# Patient Record
Sex: Female | Born: 1975 | Race: White | Hispanic: No | Marital: Married | Smoking: Current every day smoker
Health system: Southern US, Community
[De-identification: ages and names within clinical notes are randomized; demographics above are authoritative.]

## PROBLEM LIST (undated history)

## (undated) DIAGNOSIS — T8859XA Other complications of anesthesia, initial encounter: Secondary | ICD-10-CM

## (undated) DIAGNOSIS — C519 Malignant neoplasm of vulva, unspecified: Secondary | ICD-10-CM

## (undated) DIAGNOSIS — K219 Gastro-esophageal reflux disease without esophagitis: Secondary | ICD-10-CM

## (undated) DIAGNOSIS — I471 Supraventricular tachycardia, unspecified: Secondary | ICD-10-CM

## (undated) DIAGNOSIS — N751 Abscess of Bartholin's gland: Secondary | ICD-10-CM

## (undated) DIAGNOSIS — E785 Hyperlipidemia, unspecified: Secondary | ICD-10-CM

## (undated) DIAGNOSIS — E119 Type 2 diabetes mellitus without complications: Secondary | ICD-10-CM

## (undated) HISTORY — PX: APPENDECTOMY: SHX54

## (undated) HISTORY — DX: Supraventricular tachycardia: I47.1

## (undated) HISTORY — DX: Supraventricular tachycardia, unspecified: I47.10

## (undated) HISTORY — PX: WISDOM TOOTH EXTRACTION: SHX21

## (undated) HISTORY — PX: TONSILLECTOMY: SHX5217

## (undated) HISTORY — PX: BLADDER SUSPENSION: SHX72

## (undated) HISTORY — PX: ABSCESS DRAINAGE: SHX1119

## (undated) HISTORY — DX: Hyperlipidemia, unspecified: E78.5

## (undated) HISTORY — PX: OTHER SURGICAL HISTORY: SHX169

## (undated) HISTORY — DX: Type 2 diabetes mellitus without complications: E11.9

## (undated) HISTORY — PX: ABDOMINAL HYSTERECTOMY: SHX81

---

## 2017-03-15 ENCOUNTER — Other Ambulatory Visit (HOSPITAL_COMMUNITY): Payer: Self-pay | Admitting: Unknown Physician Specialty

## 2017-03-15 DIAGNOSIS — R1900 Intra-abdominal and pelvic swelling, mass and lump, unspecified site: Secondary | ICD-10-CM

## 2017-03-16 ENCOUNTER — Ambulatory Visit: Payer: Self-pay | Admitting: Family Medicine

## 2017-03-27 ENCOUNTER — Encounter (HOSPITAL_COMMUNITY): Payer: Self-pay | Admitting: Radiology

## 2017-03-27 ENCOUNTER — Encounter (HOSPITAL_COMMUNITY)
Admission: RE | Admit: 2017-03-27 | Discharge: 2017-03-27 | Disposition: A | Discharge status: Home / Self Care | Payer: No Typology Code available for payment source | Source: Ambulatory Visit | Attending: Unknown Physician Specialty | Admitting: Unknown Physician Specialty

## 2017-03-27 DIAGNOSIS — R1900 Intra-abdominal and pelvic swelling, mass and lump, unspecified site: Secondary | ICD-10-CM | POA: Diagnosis not present

## 2017-03-27 DIAGNOSIS — Z9049 Acquired absence of other specified parts of digestive tract: Secondary | ICD-10-CM

## 2017-03-27 LAB — GLUCOSE, CAPILLARY: GLUCOSE-CAPILLARY: 201 mg/dL — AB (ref 65–99)

## 2017-03-27 MED ORDER — FLUDEOXYGLUCOSE F - 18 (FDG) INJECTION
12.3300 | Freq: Once | INTRAVENOUS | Status: AC | PRN
  Administered 2017-03-27: 12.33 via INTRAVENOUS

## 2017-04-03 NOTE — Progress Notes (Deleted)
   Subjective: YW:VPXTGGYIR care, *** HPI: Toby Platt is a 42 y.o. female presenting to clinic today for:  1. ***  History reviewed. No pertinent past medical history. Past Surgical History:  Procedure Laterality Date  . ABDOMINAL HYSTERECTOMY    . APPENDECTOMY    . TONSILLECTOMY     Social History   Socioeconomic History  . Marital status: Married    Spouse name: Not on file  . Number of children: Not on file  . Years of education: Not on file  . Highest education level: Not on file  Social Needs  . Financial resource strain: Not on file  . Food insecurity - worry: Not on file  . Food insecurity - inability: Not on file  . Transportation needs - medical: Not on file  . Transportation needs - non-medical: Not on file  Occupational History  . Not on file  Tobacco Use  . Smoking status: Current Every Day Smoker    Packs/day: 1.00    Years: 15.00    Pack years: 15.00    Types: Cigarettes  . Smokeless tobacco: Never Used  Substance and Sexual Activity  . Alcohol use: No    Frequency: Never  . Drug use: No  . Sexual activity: Yes    Birth control/protection: None  Other Topics Concern  . Not on file  Social History Narrative  . Not on file   Current Meds  Medication Sig  . varenicline (CHANTIX) 1 MG tablet Take 1 mg by mouth.   Family History  Problem Relation Age of Onset  . Cancer Mother   . Ovarian cancer Mother 69  . Colon cancer Mother 82  . Heart disease Mother   . Heart disease Father   . Heart attack Father   . Breast cancer Maternal Aunt 41  . Breast cancer Maternal Aunt 47   No Known Allergies   Health Maintenance: Flu, TDap.  ROS: Per HPI  Objective: Office vital signs reviewed. BP 119/82   Pulse 91   Temp 97.6 F (36.4 C) (Oral)   Ht 5\' 9"  (1.753 m)   Wt 254 lb (115.2 kg)   BMI 37.51 kg/m   Physical Examination:  General: Awake, alert, *** nourished, No acute distress HEENT: Normal    Neck: No masses palpated. No  lymphadenopathy    Ears: Tympanic membranes intact, normal light reflex, no erythema, no bulging    Eyes: PERRLA, extraocular movement in tact, sclera ***    Nose: nasal turbinates moist, *** nasal discharge    Throat: moist mucus membranes, no erythema, *** tonsillar exudate.  Airway is patent Cardio: regular rate and rhythm, S1S2 heard, no murmurs appreciated Pulm: clear to auscultation bilaterally, no wheezes, rhonchi or rales; normal work of breathing on room air GI: soft, non-tender, non-distended, bowel sounds present x4, no hepatomegaly, no splenomegaly, no masses GU: external vaginal tissue ***, cervix ***, *** punctate lesions on cervix appreciated, *** discharge from cervical os, *** bleeding, *** cervical motion tenderness, *** abdominal/ adnexal masses Extremities: warm, well perfused, No edema, cyanosis or clubbing; +*** pulses bilaterally MSK: *** gait and *** station Skin: dry; intact; no rashes or lesions Neuro: *** Strength and light touch sensation grossly intact, *** DTRs ***/4  Assessment/ Plan: 42 y.o. female   No problem-specific Assessment & Plan notes found for this encounter.   Janora Norlander, DO [place] [phone]

## 2017-04-04 ENCOUNTER — Encounter: Payer: Self-pay | Admitting: Family Medicine

## 2017-04-04 ENCOUNTER — Ambulatory Visit: Payer: No Typology Code available for payment source | Admitting: Family Medicine

## 2017-04-04 VITALS — BP 119/82 | HR 91 | Temp 97.6°F | Ht 69.0 in | Wt 254.0 lb

## 2017-04-04 DIAGNOSIS — M62830 Muscle spasm of back: Secondary | ICD-10-CM | POA: Diagnosis not present

## 2017-04-04 DIAGNOSIS — E669 Obesity, unspecified: Secondary | ICD-10-CM | POA: Diagnosis not present

## 2017-04-04 DIAGNOSIS — Z7689 Persons encountering health services in other specified circumstances: Secondary | ICD-10-CM | POA: Diagnosis not present

## 2017-04-04 DIAGNOSIS — Z23 Encounter for immunization: Secondary | ICD-10-CM | POA: Diagnosis not present

## 2017-04-04 DIAGNOSIS — M25561 Pain in right knee: Secondary | ICD-10-CM

## 2017-04-04 DIAGNOSIS — L729 Follicular cyst of the skin and subcutaneous tissue, unspecified: Secondary | ICD-10-CM

## 2017-04-04 DIAGNOSIS — F17200 Nicotine dependence, unspecified, uncomplicated: Secondary | ICD-10-CM | POA: Diagnosis not present

## 2017-04-04 DIAGNOSIS — R6882 Decreased libido: Secondary | ICD-10-CM

## 2017-04-04 DIAGNOSIS — M25562 Pain in left knee: Secondary | ICD-10-CM

## 2017-04-04 DIAGNOSIS — G8929 Other chronic pain: Secondary | ICD-10-CM

## 2017-04-04 DIAGNOSIS — R935 Abnormal findings on diagnostic imaging of other abdominal regions, including retroperitoneum: Secondary | ICD-10-CM

## 2017-04-04 LAB — BAYER DCA HB A1C WAIVED: HB A1C (BAYER DCA - WAIVED): 7.3 % — ABNORMAL HIGH (ref <7.0)

## 2017-04-04 MED ORDER — TIZANIDINE HCL 2 MG PO CAPS: 2.0000 mg | ORAL_CAPSULE | Freq: Three times a day (TID) | ORAL | 0 refills | Status: DC | PRN

## 2017-04-04 MED ORDER — DICLOFENAC SODIUM 75 MG PO TBEC: 75.0000 mg | DELAYED_RELEASE_TABLET | Freq: Two times a day (BID) | ORAL | 0 refills | Status: DC

## 2017-04-04 NOTE — Patient Instructions (Addendum)
I have prescribed you Voltaren to take twice a day with a meal as needed for low back pain.  You may discontinue this once low back pain has resolved.  I have also prescribed you tizanidine to use up to 3 times daily if needed for breakthrough back pain/muscle spasm.  As we discussed, this can cause sedation.  Do not drive or operate heavy machinery while taking this.  You drink plenty of fluids and stay active.  Do the home exercises and stretches that I have provided you.  Follow-up if symptoms worsen or do not improve.  Schedule your mammogram  You have prescribed a nonsteroidal anti-inflammatory drug (NSAID) today. This will help with ear pain and inflammation. Please do not take any other NSAIDs (ibuprofen/Motrin/Advil, naproxen/Aleve, meloxicam/Mobic, Voltaren/diclofenac). Please make sure to eat a meal when taking this medication.   Caution:  If you have a history of acid reflux/indigestion, I recommend that you take an antacid (such as Prilosec, Prevacid) daily while on the NSAID.  If you have a history of bleeding disorder, gastric ulcer, are on a blood thinner (like warfarin/Coumadin, Xarelto, Eliquis, etc) please do not take NSAID.  If you have ever had a heart attack, you should not take NSAIDs.

## 2017-04-04 NOTE — Progress Notes (Signed)
Subjective: OI:NOMVEHMCN care, back pain HPI: Christina [name] is a 42 y.o. female presenting to clinic today for:  1. Back pain Patient notes that she was lifting a trailer on Saturday when she hurt her back.  She describes the pain as sharp and severe.  She notes that she has been using a TENS unit, topical lidocaine, Tylenol and ibuprofen with little improvement in symptoms.  She denies saddle anesthesia, urinary retention, fecal incontinence, lower extremity weakness, falls or lower extremity numbness and tingling.  2.  Tobacco use disorder Patient notes that she is currently being treated with Chantix to stop smoking.  This is been prescribed by her OB/GYN.  3.  Decreased sex drive Patient reports that she has had decreased sex drive since her 48S.  She notes she did not undergo her hysterectomy until 2013.  She has never seen a sex therapist.  She is wondering if there is something that we can do to improve her sex drive.  4.  Knot on right side Patient notes that she has had bilateral knots on her flanks for some time but they seem to have gotten slightly larger over the last year.  She denies tenderness, swelling, skin changes. No unplanned weight loss.  5.  Abnormal MRI of the pelvis Patient has been seen by OB/GYN for Bartholin cyst and abnormal MRI of the pelvis.  She was noted to have 2 complex cystic lesions within the abdomen.  These were further evaluated with a PET scan determined to be likely benign.  She notes that she occasionally has intra-abdominal pains but does report she has had a couple of intra-abdominal surgeries in the past.  She had a hysterectomy in 2013 and an appendectomy in the past as well.  History reviewed. No pertinent past medical history. Past Surgical History:  Procedure Laterality Date  . ABDOMINAL HYSTERECTOMY    . APPENDECTOMY    . TONSILLECTOMY     Social History   Socioeconomic History  . Marital status: Married    Spouse name: Not on file    . Number of children: Not on file  . Years of education: Not on file  . Highest education level: Not on file  Social Needs  . Financial resource strain: Not on file  . Food insecurity - worry: Not on file  . Food insecurity - inability: Not on file  . Transportation needs - medical: Not on file  . Transportation needs - non-medical: Not on file  Occupational History  . Not on file  Tobacco Use  . Smoking status: Current Every Day Smoker    Packs/day: 1.00    Years: 15.00    Pack years: 15.00    Types: Cigarettes  . Smokeless tobacco: Never Used  Substance and Sexual Activity  . Alcohol use: No    Frequency: Never  . Drug use: No  . Sexual activity: Yes    Birth control/protection: None  Other Topics Concern  . Not on file  Social History Narrative  . Not on file   Current Meds  Medication Sig  . varenicline (CHANTIX) 1 MG tablet Take 1 mg by mouth.   Family History  Problem Relation Age of Onset  . Cancer Mother   . Ovarian cancer Mother 23  . Colon cancer Mother 66  . Heart disease Mother   . Heart disease Father   . Heart attack Father   . Breast cancer Maternal Aunt 41  . Breast cancer Maternal Aunt 47  No Known Allergies   Health Maintenance: Flu, TDap.  ROS: Per HPI  Objective: Office vital signs reviewed. BP 119/82   Pulse 91   Temp 97.6 F (36.4 C) (Oral)   Ht 5' 9"  (1.753 m)   Wt 254 lb (115.2 kg)   BMI 37.51 kg/m   Physical Examination:  General: Awake, alert, obese, No acute distress HEENT: sclera white, MMM Cardio: regular rate and rhythm, S1S2 heard, no murmurs appreciated Pulm: clear to auscultation bilaterally, no wheezes, rhonchi or rales; normal work of breathing on room air Skin: Kidney being size rubbery mass consistent with a benign skin cyst noted on the right flank inferior to the rib cage.  This is nontender.  No overlying skin changes. MSK: Ambulates independently.  Antalgic gait   Lumbar spine: Patient has full active  range of motion but does have notable pain with flexion and extension of the spine.  She has no midline tenderness to palpation.  She does have quite a bit of tenderness to palpation in the paraspinal muscles particularly on the right in the lumbar regions.  There is palpable increased muscle tonicity in this area.  No palpable bony abnormalities.  Negative straight leg raise.  Knees: Palpable crepitus in bilateral knees.  Full active range of motion. Neuro: Light touch sensation grossly intact.  Follows all commands.  No focal neurologic deficits. Psych: Mood stable, speech normal, affect appropriate, good eye contact, normal insight and thought.  Depression screen PHQ 2/9 04/04/2017  Decreased Interest 2  Down, Depressed, Hopeless 2  PHQ - 2 Score 4  Altered sleeping 2  Tired, decreased energy 3  Change in appetite 1  Feeling bad or failure about yourself  1  Trouble concentrating 0  Suicidal thoughts 0  PHQ-9 Score 11  Difficult doing work/chores Somewhat difficult    Assessment/ Plan: 42 y.o. female   1. Tobacco use disorder Counseling provided.  Patient is actually on Chantix prescribed by her OB/GYN.  We will continue to monitor and support as able.  2. Establishing care with new doctor, encounter for Records reviewed and placed into the scanned pile for addition to her EMR.  She had an abnormal MRI of her pelvis, which was followed by a PET scan.  The PET scan suggested that the lesions that were appreciated on CT and MRI previously were likely benign but should be followed up with an MRI of her pelvis in February 2020.  We will continue to monitor patient closely.  3. Obesity (BMI 35.0-39.9 without comorbidity) Lifestyle changes recommended.  Will check basic metabolic labs to further evaluate. - CMP14+EGFR - Lipid Panel - Bayer DCA Hb A1c Waived  4. Abnormal MRI, pelvis See above. - CBC with Differential  5. Chronic pain of both knees I recommended the patient consider  taking Tylenol every 8 hours if needed for intermittent pain.  Can consider steroid injection should she need this for flares in the future.  At this time she has been asymptomatic.  6. Lumbar paraspinal muscle spasm I suspect that she is having a muscle spasm of the lumbar paraspinals.  There were no red flags on her exam or history today.  No evidence on exam to suggest disc herniation or nerve impingement.  I have placed her on Voltaren p.o. twice daily for the next week to use as needed.  Avoid other NSAIDs.  I have also prescribed Zanaflex 2 mg p.o. 3 times daily as needed breakthrough pain/muscle spasm.  Caution sedation.  Home care  instructions reviewed with the patient and handout was provided.  Home exercise program reviewed with the patient and a handout was also provided.  She will follow-up as needed with me for this issue.  7. Skin cyst Lesion on right flank appears to be benign.  She is actually had multiple imaging studies that did not suggest she had any malignant processes going on.  We discussed consideration for referral to general surgery versus plastics to have this removed should she find this irritating but she does not wish to have any invasive procedures done.  8. Decreased sex drive We discussed that there are no medications to improve sex drive, particularly in a female.  However, I did discuss that she consider seeing a therapist versus sex therapist.  Patient would be amenable to this and will look further into this.   Janora Norlander, DO [place] [phone]

## 2017-04-05 LAB — CBC WITH DIFFERENTIAL/PLATELET
Basophils Absolute: 0 10*3/uL (ref 0.0–0.2)
Basos: 0 %
EOS (ABSOLUTE): 0.1 10*3/uL (ref 0.0–0.4)
EOS: 1 %
HEMATOCRIT: 45.6 % (ref 34.0–46.6)
Hemoglobin: 15.8 g/dL (ref 11.1–15.9)
IMMATURE GRANS (ABS): 0 10*3/uL (ref 0.0–0.1)
IMMATURE GRANULOCYTES: 0 %
Lymphocytes Absolute: 2.8 10*3/uL (ref 0.7–3.1)
Lymphs: 24 %
MCH: 31 pg (ref 26.6–33.0)
MCHC: 34.6 g/dL (ref 31.5–35.7)
MCV: 89 fL (ref 79–97)
MONOCYTES: 7 %
Monocytes Absolute: 0.9 10*3/uL (ref 0.1–0.9)
Neutrophils Absolute: 8.2 10*3/uL — ABNORMAL HIGH (ref 1.4–7.0)
Neutrophils: 68 %
Platelets: 332 10*3/uL (ref 150–379)
RBC: 5.1 x10E6/uL (ref 3.77–5.28)
RDW: 14 % (ref 12.3–15.4)
WBC: 12.1 10*3/uL — AB (ref 3.4–10.8)

## 2017-04-05 LAB — CMP14+EGFR
ALBUMIN: 4.4 g/dL (ref 3.5–5.5)
ALK PHOS: 132 IU/L — AB (ref 39–117)
ALT: 25 IU/L (ref 0–32)
AST: 18 IU/L (ref 0–40)
Albumin/Globulin Ratio: 1.4 (ref 1.2–2.2)
BUN/Creatinine Ratio: 16 (ref 9–23)
BUN: 10 mg/dL (ref 6–24)
Bilirubin Total: <0.2 mg/dL (ref 0.0–1.2)
CO2: 20 mmol/L (ref 20–29)
CREATININE: 0.64 mg/dL (ref 0.57–1.00)
Calcium: 10 mg/dL (ref 8.7–10.2)
Chloride: 102 mmol/L (ref 96–106)
GFR calc Af Amer: 128 mL/min/{1.73_m2} (ref >59)
GFR calc non Af Amer: 111 mL/min/{1.73_m2} (ref >59)
GLUCOSE: 235 mg/dL — AB (ref 65–99)
Globulin, Total: 3.2 g/dL (ref 1.5–4.5)
Potassium: 4.6 mmol/L (ref 3.5–5.2)
Sodium: 138 mmol/L (ref 134–144)
Total Protein: 7.6 g/dL (ref 6.0–8.5)

## 2017-04-05 LAB — LIPID PANEL
CHOL/HDL RATIO: 5.6 ratio — AB (ref 0.0–4.4)
Cholesterol, Total: 258 mg/dL — ABNORMAL HIGH (ref 100–199)
HDL: 46 mg/dL (ref >39)
LDL CALC: 178 mg/dL — AB (ref 0–99)
TRIGLYCERIDES: 172 mg/dL — AB (ref 0–149)
VLDL Cholesterol Cal: 34 mg/dL (ref 5–40)

## 2017-04-23 ENCOUNTER — Ambulatory Visit (INDEPENDENT_AMBULATORY_CARE_PROVIDER_SITE_OTHER): Payer: No Typology Code available for payment source | Admitting: Family Medicine

## 2017-04-23 ENCOUNTER — Encounter: Payer: Self-pay | Admitting: Family Medicine

## 2017-04-23 VITALS — BP 128/89 | HR 98 | Temp 97.8°F | Ht 69.0 in | Wt 249.0 lb

## 2017-04-23 DIAGNOSIS — E1169 Type 2 diabetes mellitus with other specified complication: Secondary | ICD-10-CM | POA: Insufficient documentation

## 2017-04-23 DIAGNOSIS — E785 Hyperlipidemia, unspecified: Secondary | ICD-10-CM | POA: Diagnosis not present

## 2017-04-23 DIAGNOSIS — E119 Type 2 diabetes mellitus without complications: Secondary | ICD-10-CM | POA: Diagnosis not present

## 2017-04-23 MED ORDER — ATORVASTATIN CALCIUM 40 MG PO TABS: 40.0000 mg | ORAL_TABLET | Freq: Every day | ORAL | 3 refills | Status: DC

## 2017-04-23 MED ORDER — METFORMIN HCL ER 500 MG PO TB24: 500.0000 mg | ORAL_TABLET | Freq: Every day | ORAL | 2 refills | Status: DC

## 2017-04-23 MED ORDER — METFORMIN HCL ER 500 MG PO TB24: 500.0000 mg | ORAL_TABLET | Freq: Two times a day (BID) | ORAL | 2 refills | Status: DC

## 2017-04-23 NOTE — Assessment & Plan Note (Signed)
Newly diagnosed.  A1c of 7.3.  Start metformin XR 500 mg nightly for the next 7-10 days then increase to 1000 mg nightly.  Statin as below.  Referred to registered dietitian.  Handout for nutrition and diabetes given.  Foot exam performed and was within normal limits.  We will plan to obtain urine micro, repeat A1c and direct LDL at next visit.  Patient will also need pneumococcal vaccine and referral for DM eye exam.  Follow up in 3 months.

## 2017-04-23 NOTE — Patient Instructions (Addendum)
Start with taking metformin every night at bedtime for about 7-10 days.  You may then increase to twice daily dosing.  Diabetes Mellitus and Nutrition When you have diabetes (diabetes mellitus), it is very important to have healthy eating habits because your blood sugar (glucose) levels are greatly affected by what you eat and drink. Eating healthy foods in the appropriate amounts, at about the same times every day, can help you:  Control your blood glucose.  Lower your risk of heart disease.  Improve your blood pressure.  Reach or maintain a healthy weight.  Every person with diabetes is different, and each person has different needs for a meal plan. Your health care provider may recommend that you work with a diet and nutrition specialist (dietitian) to make a meal plan that is best for you. Your meal plan may vary depending on factors such as:  The calories you need.  The medicines you take.  Your weight.  Your blood glucose, blood pressure, and cholesterol levels.  Your activity level.  Other health conditions you have, such as heart or kidney disease.  How do carbohydrates affect me? Carbohydrates affect your blood glucose level more than any other type of food. Eating carbohydrates naturally increases the amount of glucose in your blood. Carbohydrate counting is a method for keeping track of how many carbohydrates you eat. Counting carbohydrates is important to keep your blood glucose at a healthy level, especially if you use insulin or take certain oral diabetes medicines. It is important to know how many carbohydrates you can safely have in each meal. This is different for every person. Your dietitian can help you calculate how many carbohydrates you should have at each meal and for snack. Foods that contain carbohydrates include:  Bread, cereal, rice, pasta, and crackers.  Potatoes and corn.  Peas, beans, and lentils.  Milk and yogurt.  Fruit and juice.  Desserts,  such as cakes, cookies, ice cream, and candy.  How does alcohol affect me? Alcohol can cause a sudden decrease in blood glucose (hypoglycemia), especially if you use insulin or take certain oral diabetes medicines. Hypoglycemia can be a life-threatening condition. Symptoms of hypoglycemia (sleepiness, dizziness, and confusion) are similar to symptoms of having too much alcohol. If your health care provider says that alcohol is safe for you, follow these guidelines:  Limit alcohol intake to no more than 1 drink per day for nonpregnant women and 2 drinks per day for men. One drink equals 12 oz of beer, 5 oz of wine, or 1 oz of hard liquor.  Do not drink on an empty stomach.  Keep yourself hydrated with water, diet soda, or unsweetened iced tea.  Keep in mind that regular soda, juice, and other mixers may contain a lot of sugar and must be counted as carbohydrates.  What are tips for following this plan? Reading food labels  Start by checking the serving size on the label. The amount of calories, carbohydrates, fats, and other nutrients listed on the label are based on one serving of the food. Many foods contain more than one serving per package.  Check the total grams (g) of carbohydrates in one serving. You can calculate the number of servings of carbohydrates in one serving by dividing the total carbohydrates by 15. For example, if a food has 30 g of total carbohydrates, it would be equal to 2 servings of carbohydrates.  Check the number of grams (g) of saturated and trans fats in one serving. Choose foods that  have low or no amount of these fats.  Check the number of milligrams (mg) of sodium in one serving. Most people should limit total sodium intake to less than 2,300 mg per day.  Always check the nutrition information of foods labeled as "low-fat" or "nonfat". These foods may be higher in added sugar or refined carbohydrates and should be avoided.  Talk to your dietitian to identify  your daily goals for nutrients listed on the label. Shopping  Avoid buying canned, premade, or processed foods. These foods tend to be high in fat, sodium, and added sugar.  Shop around the outside edge of the grocery store. This includes fresh fruits and vegetables, bulk grains, fresh meats, and fresh dairy. Cooking  Use low-heat cooking methods, such as baking, instead of high-heat cooking methods like deep frying.  Cook using healthy oils, such as olive, canola, or sunflower oil.  Avoid cooking with butter, cream, or high-fat meats. Meal planning  Eat meals and snacks regularly, preferably at the same times every day. Avoid going long periods of time without eating.  Eat foods high in fiber, such as fresh fruits, vegetables, beans, and whole grains. Talk to your dietitian about how many servings of carbohydrates you can eat at each meal.  Eat 4-6 ounces of lean protein each day, such as lean meat, chicken, fish, eggs, or tofu. 1 ounce is equal to 1 ounce of meat, chicken, or fish, 1 egg, or 1/4 cup of tofu.  Eat some foods each day that contain healthy fats, such as avocado, nuts, seeds, and fish. Lifestyle   Check your blood glucose regularly.  Exercise at least 30 minutes 5 or more days each week, or as told by your health care provider.  Take medicines as told by your health care provider.  Do not use any products that contain nicotine or tobacco, such as cigarettes and e-cigarettes. If you need help quitting, ask your health care provider.  Work with a Social worker or diabetes educator to identify strategies to manage stress and any emotional and social challenges. What are some questions to ask my health care provider?  Do I need to meet with a diabetes educator?  Do I need to meet with a dietitian?  What number can I call if I have questions?  When are the best times to check my blood glucose? Where to find more information:  American Diabetes Association:  diabetes.org/food-and-fitness/food  Academy of Nutrition and Dietetics: PokerClues.dk  [employer] of Diabetes and Digestive and Kidney Diseases (NIH): ContactWire.be Summary  A healthy meal plan will help you control your blood glucose and maintain a healthy lifestyle.  Working with a diet and nutrition specialist (dietitian) can help you make a meal plan that is best for you.  Keep in mind that carbohydrates and alcohol have immediate effects on your blood glucose levels. It is important to count carbohydrates and to use alcohol carefully. This information is not intended to replace advice given to you by your health care provider. Make sure you discuss any questions you have with your health care provider. Document Released: 10/27/2004 Document Revised: 03/06/2016 Document Reviewed: 03/06/2016 Elsevier Interactive Patient Education  [employer].

## 2017-04-23 NOTE — Assessment & Plan Note (Signed)
Normal LFTs.  Start Lipitor 40 mg nightly.  We will plan to recheck direct LDL in 3 months.

## 2017-04-23 NOTE — Progress Notes (Signed)
Subjective: CC: New onset DM2 HPI: Christina [name] is a 42 y.o. female presenting to clinic today for:  1. DM2/ HLD New diagnosis for patient.  She is found to have a hemoglobin A1c of 7.32 weeks ago.  She notes a past medical history significant for gestational diabetes with her fourth pregnancy.  She has multiple family members with type 2 diabetes including her maternal grandmother, maternal aunts and uncles.  Her paternal grandfather also has type 2 diabetes.  She denies lower extremity numbness or tingling, polydipsia, polyuria, visual disturbance, chest pain, shortness of breath.  No unplanned weight loss or weight gain.  She notes that she does typically eat a high carb diet.  ROS: Per HPI  No past medical history on file. No Known Allergies  Current Outpatient Medications:  .  varenicline (CHANTIX) 1 MG tablet, Take 1 mg by mouth., Disp: , Rfl:  Social History   Socioeconomic History  . Marital status: Married    Spouse name: Not on file  . Number of children: Not on file  . Years of education: Not on file  . Highest education level: Not on file  Social Needs  . Financial resource strain: Not on file  . Food insecurity - worry: Not on file  . Food insecurity - inability: Not on file  . Transportation needs - medical: Not on file  . Transportation needs - non-medical: Not on file  Occupational History  . Not on file  Tobacco Use  . Smoking status: Current Every Day Smoker    Packs/day: 1.00    Years: 15.00    Pack years: 15.00    Types: Cigarettes  . Smokeless tobacco: Never Used  Substance and Sexual Activity  . Alcohol use: No    Frequency: Never  . Drug use: No  . Sexual activity: Yes    Birth control/protection: None  Other Topics Concern  . Not on file  Social History Narrative  . Not on file   Family History  Problem Relation Age of Onset  . Cancer Mother   . Ovarian cancer Mother 84  . Colon cancer Mother 7  . Heart disease Mother   . Heart  disease Father   . Heart attack Father   . Breast cancer Maternal Aunt 41  . Breast cancer Maternal Aunt 47    Health Maintenance: Need PNA, Urine Microalbumin, DM eye exam  Objective: Office vital signs reviewed. BP 128/89   Pulse 98   Temp 97.8 F (36.6 C) (Oral)   Ht 5\' 9"  (1.753 m)   Wt 249 lb (112.9 kg)   BMI 36.77 kg/m   Physical Examination:  General: Awake, alert, obese, No acute distress HEENT: Normal    Eyes: PERRLA, extraocular movement in tact, sclera white    Throat: moist mucus membranes Cardio: regular rate and rhythm, S1S2 heard, no murmurs appreciated Pulm: clear to auscultation bilaterally, no wheezes, rhonchi or rales; normal work of breathing on room air Extremities: warm, well perfused, No edema, cyanosis or clubbing; +2 pulses bilaterally MSK: normal gait and normal station Neuro: DM exam as below  Diabetic Foot Form - Detailed   Diabetic Foot Exam - detailed Diabetic Foot exam was performed with the following findings:  Yes 04/23/2017  9:00 AM  Visual Foot Exam completed.:  Yes  Can the patient see the bottom of their feet?:  Yes Are the shoes appropriate in style and fit?:  Yes Is there swelling or and abnormal foot shape?:  No Is  there a claw toe deformity?:  Yes Is there elevated skin temparature?:  No Is there foot or ankle muscle weakness?:  No Normal Range of Motion:  Yes Pulse Foot Exam completed.:  Yes  Right posterior Tibialias:  Present Left posterior Tibialias:  Present  Right Dorsalis Pedis:  Present Left Dorsalis Pedis:  Present  Sensory Foot Exam Completed.:  Yes Semmes-Weinstein Monofilament Test R Site 1-Great Toe:  Pos L Site 1-Great Toe:  Pos        Assessment/ Plan: 42 y.o. female   Type 2 diabetes mellitus without complication, without long-term current use of insulin ([place]) Newly diagnosed.  A1c of 7.3.  Start metformin XR 500 mg nightly for the next 7-10 days then increase to 1000 mg nightly.  Statin as below.  Referred  to registered dietitian.  Handout for nutrition and diabetes given.  Foot exam performed and was within normal limits.  We will plan to obtain urine micro, repeat A1c and direct LDL at next visit.  Patient will also need pneumococcal vaccine and referral for DM eye exam.  Follow up in 3 months.  Hyperlipidemia associated with type 2 diabetes mellitus ([facility]) Normal LFTs.  Start Lipitor 40 mg nightly.  We will plan to recheck direct LDL in 3 months.   Janora Norlander, DO [place] [phone]

## 2017-04-30 ENCOUNTER — Telehealth: Payer: Self-pay

## 2017-04-30 NOTE — Telephone Encounter (Signed)
Started on Metformin and Lipitor  Really dizzy  Did not take either yesterday feeling alittle less dizzy today  Needs advise

## 2017-04-30 NOTE — Telephone Encounter (Signed)
Pt notified of recommendation Verbalizes understanding 

## 2017-04-30 NOTE — Telephone Encounter (Signed)
This is somewhat unsual and not typical of either medication.  Have her start by resuming the Metformin only.  If she is doing well on this after a few days, she can add back the Lipitor.  Have her increase her water intake.  If she continues to have problems with either, let me know.

## 2017-05-07 LAB — HM MAMMOGRAPHY

## 2017-05-30 ENCOUNTER — Ambulatory Visit: Payer: No Typology Code available for payment source | Admitting: Nutrition

## 2017-06-28 ENCOUNTER — Ambulatory Visit: Payer: No Typology Code available for payment source | Admitting: Nutrition

## 2017-07-25 ENCOUNTER — Ambulatory Visit: Payer: No Typology Code available for payment source | Admitting: Family Medicine

## 2017-08-28 ENCOUNTER — Ambulatory Visit: Payer: No Typology Code available for payment source | Admitting: Nutrition

## 2018-03-26 ENCOUNTER — Ambulatory Visit: Payer: No Typology Code available for payment source | Admitting: "Endocrinology

## 2018-05-23 ENCOUNTER — Telehealth: Payer: Self-pay | Admitting: Family Medicine

## 2018-05-23 ENCOUNTER — Other Ambulatory Visit: Payer: Self-pay

## 2018-05-23 MED ORDER — SPINOSAD 0.9 % EX SUSP: 1.0000 "application " | Freq: Once | CUTANEOUS | 1 refills | Status: AC

## 2018-05-23 MED ORDER — SPINOSAD 0.9 % EX SUSP: 1.0000 "application " | Freq: Once | CUTANEOUS | 1 refills | Status: DC

## 2018-05-23 NOTE — Telephone Encounter (Signed)
Pt aware.

## 2018-05-23 NOTE — Telephone Encounter (Signed)
I sent [name] for the patient

## 2018-10-22 ENCOUNTER — Encounter: Payer: Self-pay | Admitting: *Deleted

## 2018-10-24 ENCOUNTER — Ambulatory Visit (INDEPENDENT_AMBULATORY_CARE_PROVIDER_SITE_OTHER): Payer: BC Managed Care – PPO | Admitting: Cardiology

## 2018-10-24 ENCOUNTER — Other Ambulatory Visit: Payer: Self-pay

## 2018-10-24 ENCOUNTER — Encounter: Payer: Self-pay | Admitting: Cardiology

## 2018-10-24 VITALS — BP 100/66 | HR 96 | Temp 97.7°F | Ht 69.0 in | Wt 227.0 lb

## 2018-10-24 DIAGNOSIS — E782 Mixed hyperlipidemia: Secondary | ICD-10-CM

## 2018-10-24 DIAGNOSIS — I471 Supraventricular tachycardia: Secondary | ICD-10-CM

## 2018-10-24 DIAGNOSIS — E119 Type 2 diabetes mellitus without complications: Secondary | ICD-10-CM | POA: Diagnosis not present

## 2018-10-24 DIAGNOSIS — F17200 Nicotine dependence, unspecified, uncomplicated: Secondary | ICD-10-CM

## 2018-10-24 DIAGNOSIS — R0789 Other chest pain: Secondary | ICD-10-CM | POA: Diagnosis not present

## 2018-10-24 DIAGNOSIS — E785 Hyperlipidemia, unspecified: Secondary | ICD-10-CM | POA: Insufficient documentation

## 2018-10-24 MED ORDER — ATORVASTATIN CALCIUM 40 MG PO TABS: 40.0000 mg | ORAL_TABLET | Freq: Every day | ORAL | 3 refills | Status: DC

## 2018-10-24 NOTE — Patient Instructions (Signed)
Medication Instructions:  Your physician recommends that you continue on your current medications as directed. Please refer to the Current Medication list given to you today.  If you need a refill on your cardiac medications before your next appointment, please call your pharmacy.   Lab work: Your physician recommends that you return for lab work in: 3 Months Lipid ,BMP,Magnesium,A1C  If you have labs (blood work) drawn today and your tests are completely normal, you will receive your results only by: Marland Kitchen MyChart Message (if you have MyChart) OR . A paper copy in the mail If you have any lab test that is abnormal or we need to change your treatment, we will call you to review the results.  Testing/Procedures: Your physician has requested that you have an echocardiogram. Echocardiography is a painless test that uses sound waves to create images of your heart. It provides your doctor with information about the size and shape of your heart and how well your heart's chambers and valves are working. This procedure takes approximately one hour. There are no restrictions for this procedure.  Your physician has recommended that you wear a ZIO monitor. ZIO monitors are medical devices that record the heart's electrical activity. Doctors most often use these monitors to diagnose arrhythmias. Arrhythmias are problems with the speed or rhythm of the heartbeat. The monitor is a small, portable device. You can wear one while you do your normal daily activities. This is usually used to diagnose what is causing palpitations/syncope (passing out).    Follow-Up: At [facility], you and your health needs are our priority.  As part of our continuing mission to provide you with exceptional heart care, we have created designated Provider Care Teams.  These Care Teams include your primary Cardiologist (physician) and Advanced Practice Providers (APPs -  Physician Assistants and Nurse Practitioners) who all work  together to provide you with the care you need, when you need it. You will need a follow up appointment in 3 months.  Please call our office 2 months in advance to schedule this appointment.  You may see Berniece Salines, DOAny Other Special Instructions Will Be Listed Below (If Applicable).

## 2018-10-24 NOTE — Progress Notes (Signed)
Cardiology Office Note:    Date:  10/25/2018   ID:  Christina [name], DOB [birth year], MRN [id number]  PCP:  Berniece Salines, DO  Cardiologist:  Berniece Salines, DO  Electrophysiologist:  None   Referring MD: Janora Norlander, DO   CC: Recent hospitalization for SVT   History of Present Illness:    Christina [name] is a 43 y.o. female with a hx of SVT diagnosed earlier this year, DMII on metformin, HLN and recently diagnosed rocky mountain spotty fever presents to be evaluated for her SVT. She notes that since her diagnosis early this year she had small bouts and svt which would resolve on its own. She notes that about a month she was diagnosed with rocky mountain spotted fever and since then she believes she has had increasing episodes for her SVT. At a result on 10/18/2018 she had an episode which required an ED visit. During that time she was noted to be again in SVT - she was started on toprol xl and discharge to home. The episodes have decreased on toprol but not resolved. She reports an abrupt onset of fast heart rate which last for about 5-10 mins at a time. She admits to associated shortness of breath, chest pain and sometime lightheadedness. She denies neck fullness.  Past Medical History:  Diagnosis Date  . Diabetes mellitus without complication ([place])   . Hyperlipidemia   . SVT (supraventricular tachycardia) ([facility])     Past Surgical History:  Procedure Laterality Date  . ABDOMINAL HYSTERECTOMY    . APPENDECTOMY    . TONSILLECTOMY      Current Medications: Current Meds  Medication Sig  . metFORMIN (GLUCOPHAGE) 500 MG tablet Take 500 mg by mouth daily.  . metoprolol succinate (TOPROL-XL) 25 MG 24 hr tablet Take 25 mg by mouth daily.  . [DISCONTINUED] metFORMIN (GLUCOPHAGE XR) 500 MG 24 hr tablet Take 1 tablet (500 mg total) by mouth 2 (two) times daily. (Patient taking differently: Take 500 mg by mouth daily with breakfast. )     Allergies:   Patient has no known allergies.   Social  History   Socioeconomic History  . Marital status: Married    Spouse name: Not on file  . Number of children: Not on file  . Years of education: Not on file  . Highest education level: Not on file  Occupational History  . Not on file  Social Needs  . Financial resource strain: Not on file  . Food insecurity    Worry: Not on file    Inability: Not on file  . Transportation needs    Medical: Not on file    Non-medical: Not on file  Tobacco Use  . Smoking status: Current Every Day Smoker    Packs/day: 1.00    Years: 15.00    Pack years: 15.00    Types: Cigarettes  . Smokeless tobacco: Never Used  Substance and Sexual Activity  . Alcohol use: No    Frequency: Never  . Drug use: No  . Sexual activity: Yes    Birth control/protection: None  Lifestyle  . Physical activity    Days per week: Not on file    Minutes per session: Not on file  . Stress: Not on file  Relationships  . Social Herbalist on phone: Not on file    Gets together: Not on file    Attends religious service: Not on file    Active member of club or organization: Not  on file    Attends meetings of clubs or organizations: Not on file    Relationship status: Not on file  Other Topics Concern  . Not on file  Social History Narrative  . Not on file     Family History: The patient's family history includes Breast cancer (age of onset: 43) in her maternal aunt; Breast cancer (age of onset: 33) in her maternal aunt; Cancer in her mother; Colon cancer (age of onset: 24) in her mother; Heart attack in her father; Heart disease in her father and mother; Ovarian cancer (age of onset: 64) in her mother.  ROS:   Review of Systems  Constitutional: Negative for fever, malaise/fatigue and weight loss.  HENT: Negative for congestion, ear pain, hearing loss, sinus pain and sore throat.   Eyes: Negative for blurred vision, double vision and discharge.  Respiratory: Negative for cough, shortness of breath and  wheezing.   Cardiovascular: Positive for chest pain and palpitations. Negative for leg swelling and PND.  Gastrointestinal: Negative for abdominal pain, blood in stool, heartburn and vomiting.  Genitourinary: Negative for dysuria, flank pain and frequency.  Musculoskeletal: Negative for back pain, joint pain and neck pain.  Skin: Negative for rash.  Neurological: Negative for dizziness, sensory change, seizures, weakness and headaches.  Endo/Heme/Allergies: Negative for environmental allergies. Does not bruise/bleed easily.  Psychiatric/Behavioral: Negative for depression and substance abuse. The patient is not nervous/anxious.      EKGs/Labs/Other Studies Reviewed:    The following studies were reviewed today:  EKG:  The ekg ordered today demonstrates sinus rhythm with HR 68bpm.  Recent Labs: No results found for requested labs within last 8760 hours.  Recent Lipid Panel    Component Value Date/Time   CHOL 258 (H) 04/04/2017 1449   TRIG 172 (H) 04/04/2017 1449   HDL 46 04/04/2017 1449   CHOLHDL 5.6 (H) 04/04/2017 1449   LDLCALC 178 (H) 04/04/2017 1449    Physical Exam:    VS:  BP 100/66 (BP Location: Left Arm, Patient Position: Sitting, Cuff Size: Normal)   Pulse 96   Temp 97.7 F (36.5 C)   Ht 5\' 9"  (1.753 m)   Wt 227 lb (103 kg)   SpO2 95%   BMI 33.52 kg/m     Wt Readings from Last 3 Encounters:  10/24/18 227 lb (103 kg)  04/23/17 249 lb (112.9 kg)  04/04/17 254 lb (115.2 kg)     GEN:  Well nourished, well developed in no acute distress HEENT: Normal NECK: No JVD; No carotid bruits LYMPHATICS: No lymphadenopathy CARDIAC: RRR, no murmurs, rubs, gallops RESPIRATORY:  Clear to auscultation without rales, wheezing or rhonchi  ABDOMEN: Soft, non-tender, non-distended EXTREMITIES: no cyanosis, no edema, no clubbing MUSCULOSKELETAL:  No edema; No deformity  SKIN: Warm and dry NEUROLOGIC:  Alert and oriented x 3 PSYCHIATRIC:  Normal affect   ASSESSMENT:    1.  Chest tightness   2. SVT (supraventricular tachycardia) ([place])   3. Diabetes mellitus without complication ([place])   4. Tobacco use disorder   5. Mixed hyperlipidemia    PLAN:    In order of problems listed above:  1. An ambulatory monitoring system has been ordered to assess for rhythm changes and heart rate excursions. A transthoracic echocardiogram will assess RV/LV function and also for structural abnormalities. 2. Moderate dose statin ordered in the setting of DM and elevated LDL. Continue with current medication regimen. 3. Smoking cessation advised.  4. She will follow up in 3 months.  Medication Adjustments/Labs and Tests Ordered: Current medicines are reviewed at length with the patient today.  Concerns regarding medicines are outlined above.  Orders Placed This Encounter  Procedures  . Lipid Profile  . Basic Metabolic Panel (BMET)  . Magnesium  . HgB A1c  . LONG TERM MONITOR (3-14 DAYS)  . EKG 12-Lead  . ECHOCARDIOGRAM COMPLETE   Meds ordered this encounter  Medications  . atorvastatin (LIPITOR) 40 MG tablet    Sig: Take 1 tablet (40 mg total) by mouth daily.    Dispense:  90 tablet    Refill:  3    Patient Instructions  Medication Instructions:  Your physician recommends that you continue on your current medications as directed. Please refer to the Current Medication list given to you today.  If you need a refill on your cardiac medications before your next appointment, please call your pharmacy.   Lab work: Your physician recommends that you return for lab work in: 3 Months Lipid ,BMP,Magnesium,A1C  If you have labs (blood work) drawn today and your tests are completely normal, you will receive your results only by: Marland Kitchen MyChart Message (if you have MyChart) OR . A paper copy in the mail If you have any lab test that is abnormal or we need to change your treatment, we will call you to review the results.  Testing/Procedures: Your physician has requested that  you have an echocardiogram. Echocardiography is a painless test that uses sound waves to create images of your heart. It provides your doctor with information about the size and shape of your heart and how well your heart's chambers and valves are working. This procedure takes approximately one hour. There are no restrictions for this procedure.  Your physician has recommended that you wear a ZIO monitor. ZIO monitors are medical devices that record the heart's electrical activity. Doctors most often use these monitors to diagnose arrhythmias. Arrhythmias are problems with the speed or rhythm of the heartbeat. The monitor is a small, portable device. You can wear one while you do your normal daily activities. This is usually used to diagnose what is causing palpitations/syncope (passing out).    Follow-Up: At [facility], you and your health needs are our priority.  As part of our continuing mission to provide you with exceptional heart care, we have created designated Provider Care Teams.  These Care Teams include your primary Cardiologist (physician) and Advanced Practice Providers (APPs -  Physician Assistants and Nurse Practitioners) who all work together to provide you with the care you need, when you need it. You will need a follow up appointment in 3 months.  Please call our office 2 months in advance to schedule this appointment.  You may see Berniece Salines, DOAny Other Special Instructions Will Be Listed Below (If Applicable).       Rolly Pancake, DO  10/25/2018 11:03 PM    [facility]

## 2018-10-25 ENCOUNTER — Encounter: Payer: Self-pay | Admitting: Cardiology

## 2018-10-29 ENCOUNTER — Ambulatory Visit (INDEPENDENT_AMBULATORY_CARE_PROVIDER_SITE_OTHER): Payer: BC Managed Care – PPO

## 2018-10-29 DIAGNOSIS — I471 Supraventricular tachycardia: Secondary | ICD-10-CM | POA: Diagnosis not present

## 2018-11-19 ENCOUNTER — Telehealth: Payer: Self-pay | Admitting: *Deleted

## 2018-11-19 NOTE — Telephone Encounter (Signed)
Telephone call to patient. Informed of monitor result and need to come in for further review. Appointment made for Oct 15 at 1100. Patient not experiencing any symptoms at this time.

## 2018-11-19 NOTE — Telephone Encounter (Signed)
Please call in find out if Javier is experiencing any lightheadedness or dizziness.  Secondly, please have her see [place] at our next available, hopefully this week here at [facility].

## 2018-11-19 NOTE — Telephone Encounter (Signed)
Telephone call from [employer] Spoke with Christina [name]. Stated patient had 1 episode of 2 nd degree heartblock with rate of 22. Report printed off and given to Dr Harriet Masson for review.

## 2018-11-26 ENCOUNTER — Ambulatory Visit: Payer: No Typology Code available for payment source | Admitting: Cardiology

## 2018-11-27 ENCOUNTER — Ambulatory Visit (INDEPENDENT_AMBULATORY_CARE_PROVIDER_SITE_OTHER): Payer: BC Managed Care – PPO

## 2018-11-27 ENCOUNTER — Other Ambulatory Visit: Payer: Self-pay

## 2018-11-27 DIAGNOSIS — I471 Supraventricular tachycardia: Secondary | ICD-10-CM | POA: Diagnosis not present

## 2018-11-27 NOTE — Progress Notes (Signed)
Complete echocardiogram has been performed.  Jimmy Altie Savard RDCS, RVT 

## 2018-11-28 ENCOUNTER — Ambulatory Visit: Payer: No Typology Code available for payment source | Admitting: Cardiology

## 2018-11-28 LAB — LIPID PANEL
Chol/HDL Ratio: 5.1 ratio — ABNORMAL HIGH (ref 0.0–4.4)
Cholesterol, Total: 192 mg/dL (ref 100–199)
HDL: 38 mg/dL — ABNORMAL LOW (ref >39)
LDL Chol Calc (NIH): 128 mg/dL — ABNORMAL HIGH (ref 0–99)
Triglycerides: 145 mg/dL (ref 0–149)
VLDL Cholesterol Cal: 26 mg/dL (ref 5–40)

## 2018-11-28 LAB — BASIC METABOLIC PANEL
BUN/Creatinine Ratio: 11 (ref 9–23)
BUN: 8 mg/dL (ref 6–24)
CO2: 19 mmol/L — ABNORMAL LOW (ref 20–29)
Calcium: 9.3 mg/dL (ref 8.7–10.2)
Chloride: 103 mmol/L (ref 96–106)
Creatinine, Ser: 0.75 mg/dL (ref 0.57–1.00)
GFR calc Af Amer: 114 mL/min/{1.73_m2} (ref >59)
GFR calc non Af Amer: 99 mL/min/{1.73_m2} (ref >59)
Glucose: 150 mg/dL — ABNORMAL HIGH (ref 65–99)
Potassium: 4.3 mmol/L (ref 3.5–5.2)
Sodium: 137 mmol/L (ref 134–144)

## 2018-11-28 LAB — MAGNESIUM: Magnesium: 2 mg/dL (ref 1.6–2.3)

## 2018-11-28 LAB — HEMOGLOBIN A1C
Est. average glucose Bld gHb Est-mCnc: 146 mg/dL
Hgb A1c MFr Bld: 6.7 % — ABNORMAL HIGH (ref 4.8–5.6)

## 2018-11-29 ENCOUNTER — Other Ambulatory Visit: Payer: Self-pay

## 2018-11-29 ENCOUNTER — Ambulatory Visit (INDEPENDENT_AMBULATORY_CARE_PROVIDER_SITE_OTHER): Payer: BC Managed Care – PPO | Admitting: Cardiology

## 2018-11-29 ENCOUNTER — Encounter: Payer: Self-pay | Admitting: Cardiology

## 2018-11-29 VITALS — BP 110/60 | HR 84 | Ht 69.0 in | Wt 228.0 lb

## 2018-11-29 DIAGNOSIS — E669 Obesity, unspecified: Secondary | ICD-10-CM

## 2018-11-29 DIAGNOSIS — E1169 Type 2 diabetes mellitus with other specified complication: Secondary | ICD-10-CM

## 2018-11-29 DIAGNOSIS — E119 Type 2 diabetes mellitus without complications: Secondary | ICD-10-CM

## 2018-11-29 DIAGNOSIS — I441 Atrioventricular block, second degree: Secondary | ICD-10-CM | POA: Diagnosis not present

## 2018-11-29 DIAGNOSIS — F17200 Nicotine dependence, unspecified, uncomplicated: Secondary | ICD-10-CM

## 2018-11-29 DIAGNOSIS — I471 Supraventricular tachycardia: Secondary | ICD-10-CM

## 2018-11-29 DIAGNOSIS — E782 Mixed hyperlipidemia: Secondary | ICD-10-CM

## 2018-11-29 DIAGNOSIS — E785 Hyperlipidemia, unspecified: Secondary | ICD-10-CM

## 2018-11-29 MED ORDER — METOPROLOL SUCCINATE ER 25 MG PO TB24: 12.5000 mg | ORAL_TABLET | Freq: Every day | ORAL | Status: DC

## 2018-11-29 NOTE — Patient Instructions (Signed)
Medication Instructions:  Your physician has recommended you make the following change in your medication:  DECREASE metoprolol succinate (toprol-XL) 25 mg: Take 0.5 tablet (12.5 mg) daily   *If you need a refill on your cardiac medications before your next appointment, please call your pharmacy*  Lab Work: None  If you have labs (blood work) drawn today and your tests are completely normal, you will receive your results only by: Marland Kitchen MyChart Message (if you have MyChart) OR . A paper copy in the mail If you have any lab test that is abnormal or we need to change your treatment, we will call you to review the results.  Testing/Procedures: You have been referred to see an electrophysiologist, Dr. Curt Bears, in the [facility] office. You will be scheduled for an appointment at the front desk.   Follow-Up: At [facility], you and your health needs are our priority.  As part of our continuing mission to provide you with exceptional heart care, we have created designated Provider Care Teams.  These Care Teams include your primary Cardiologist (physician) and Advanced Practice Providers (APPs -  Physician Assistants and Nurse Practitioners) who all work together to provide you with the care you need, when you need it.  Your next appointment:   3 months  The format for your next appointment:   In Person  Provider:   Berniece Salines, DO

## 2018-11-29 NOTE — Progress Notes (Signed)
Cardiology Office Note:    Date:  11/29/2018   ID:  Tiondra Heroux, DOB [birth year], MRN [id number]  PCP:  Berniece Salines, DO  Cardiologist:  Berniece Salines, DO  Electrophysiologist:  None   Referring MD: Berniece Salines, DO   Chief Complaint  Patient presents with  . Follow-up    testing    History of Present Illness:    Christina [name] is a 43 y.o. female with a hx of SVT diagnosed earlier this year she was started on metoprolol succinate 25 mg,, DMII on metformin, HLN and recently diagnosed [place].  I did see the patient October 24, 2018 at which time her establishing cardiac care.  During her initial visit even though she was on the 25 mg of metoprolol succinate she still is experiencing more palpitations.  Giving her palpitations on beta-blocker Zio patch was placed on patient to see how well this medication was controlling her pain. In the interim the patient did wear the  Zio patch.  She also was on a transthoracic echocardiogram which she completed.  She is here today for follow-up to discuss her results.   Today she tells me that she is still experiencing intermittent palpitations. She denies any lightheadedness or dizziness.   Past Medical History:  Diagnosis Date  . Diabetes mellitus without complication ([place])   . Hyperlipidemia   . SVT (supraventricular tachycardia) (HCC)     Past Surgical History:  Procedure Laterality Date  . ABDOMINAL HYSTERECTOMY    . APPENDECTOMY    . TONSILLECTOMY      Current Medications: Current Meds  Medication Sig  . atorvastatin (LIPITOR) 40 MG tablet Take 1 tablet (40 mg total) by mouth daily.  . metFORMIN (GLUCOPHAGE) 500 MG tablet Take 500 mg by mouth daily.  . metoprolol succinate (TOPROL-XL) 25 MG 24 hr tablet Take 0.5 tablets (12.5 mg total) by mouth daily.  . [DISCONTINUED] metoprolol succinate (TOPROL-XL) 25 MG 24 hr tablet Take 25 mg by mouth daily.     Allergies:   Patient has no known allergies.   Social History    Socioeconomic History  . Marital status: Married    Spouse name: Not on file  . Number of children: Not on file  . Years of education: Not on file  . Highest education level: Not on file  Occupational History  . Not on file  Social Needs  . Financial resource strain: Not on file  . Food insecurity    Worry: Not on file    Inability: Not on file  . Transportation needs    Medical: Not on file    Non-medical: Not on file  Tobacco Use  . Smoking status: Current Every Day Smoker    Packs/day: 1.00    Years: 15.00    Pack years: 15.00    Types: Cigarettes  . Smokeless tobacco: Never Used  Substance and Sexual Activity  . Alcohol use: No    Frequency: Never  . Drug use: No  . Sexual activity: Yes    Birth control/protection: None  Lifestyle  . Physical activity    Days per week: Not on file    Minutes per session: Not on file  . Stress: Not on file  Relationships  . Social Herbalist on phone: Not on file    Gets together: Not on file    Attends religious service: Not on file    Active member of club or organization: Not on file    Attends  meetings of clubs or organizations: Not on file    Relationship status: Not on file  Other Topics Concern  . Not on file  Social History Narrative  . Not on file     Family History: The patient's family history includes Breast cancer (age of onset: 68) in her maternal aunt; Breast cancer (age of onset: 102) in her maternal aunt; Cancer in her mother; Colon cancer (age of onset: 43) in her mother; Heart attack in her father; Heart disease in her father and mother; Ovarian cancer (age of onset: 56) in her mother.  ROS:   Review of Systems  Constitution: Negative for decreased appetite, fever and weight gain.  HENT: Negative for congestion, ear discharge, hoarse voice and sore throat.   Eyes: Negative for discharge, redness, vision loss in right eye and visual halos.  Cardiovascular: Negative for chest pain, dyspnea on  exertion, leg swelling, orthopnea and palpitations.  Respiratory: Negative for cough, hemoptysis, shortness of breath and snoring.   Endocrine: Negative for heat intolerance and polyphagia.  Hematologic/Lymphatic: Negative for bleeding problem. Does not bruise/bleed easily.  Skin: Negative for flushing, nail changes, rash and suspicious lesions.  Musculoskeletal: Negative for arthritis, joint pain, muscle cramps, myalgias, neck pain and stiffness.  Gastrointestinal: Negative for abdominal pain, bowel incontinence, diarrhea and excessive appetite.  Genitourinary: Negative for decreased libido, genital sores and incomplete emptying.  Neurological: Negative for brief paralysis, focal weakness, headaches and loss of balance.  Psychiatric/Behavioral: Negative for altered mental status, depression and suicidal ideas.  Allergic/Immunologic: Negative for HIV exposure and persistent infections.    EKGs/Labs/Other Studies Reviewed:    The following studies were reviewed today:   EKG:  None today.  TTE IMPRESSIONS 11/27/2018.  1. Left ventricular ejection fraction, by visual estimation, is 55 to 60%. The left ventricle has normal function. Normal left ventricular size. There is no left ventricular hypertrophy. Normal Diastolic Function.  2. Global right ventricle has normal systolic function.The right ventricular size is normal. No increase in right ventricular wall thickness.  3. Left atrial size was normal.  4. Right atrial size was normal.  5. The mitral valve is normal in structure. No evidence of mitral valve regurgitation. No evidence of mitral stenosis.  6. The tricuspid valve is normal in structure. Tricuspid valve regurgitation is trivial.  7. The aortic valve is normal in structure. Aortic valve regurgitation was not visualized by color flow Doppler. Structurally normal aortic valve, with no evidence of sclerosis or stenosis.  8. The pulmonic valve was not well visualized. Pulmonic valve  regurgitation is not visualized by color flow Doppler.  9. The aorta is normal size. No coarctation of the aorta by color doppler. 10. Normal pulmonary artery systolic pressure.  Long term ambulatory monitoring. 10/29/2018 The patient wore the monitor for 13 days, 24 hours. Indication - Supraventricular tachycardia  Minimum HR 22 bpm with maximum HR 144 bpm and average HR 89 bpm. Predominant underlying rhythm was Sinus Rhythm.  1 run of Supraventricular Tachycardia occurred lasting 24.5 secs with a max rate of 144 bpm (avg 111 bpm). 1 episode of AV Block (2nd/High Grade) occurred, lasting a total of 10 secs. Premature atrial complexes were rare (<1.0%) Premature Ventricular complexes were rare (<1.0%). No Ventricular tachycardia, no atrial fibrillation. No patient triggered events or diaries noted.     Recent Labs: 11/27/2018: BUN 8; Creatinine, Ser 0.75; Magnesium 2.0; Potassium 4.3; Sodium 137  Recent Lipid Panel    Component Value Date/Time   CHOL 192 11/27/2018 0931  TRIG 145 11/27/2018 0931   HDL 38 (L) 11/27/2018 0931   CHOLHDL 5.1 (H) 11/27/2018 0931   LDLCALC 128 (H) 11/27/2018 0931    Physical Exam:    VS:  BP 110/60 (BP Location: Right Arm, Patient Position: Sitting, Cuff Size: Large)   Pulse 84   Ht 5\' 9"  (1.753 m)   Wt 228 lb (103.4 kg)   SpO2 98%   BMI 33.67 kg/m     Wt Readings from Last 3 Encounters:  11/29/18 228 lb (103.4 kg)  10/24/18 227 lb (103 kg)  04/23/17 249 lb (112.9 kg)     GEN: Well nourished, well developed in no acute distress HEENT: Normal NECK: No JVD; No carotid bruits LYMPHATICS: No lymphadenopathy CARDIAC: S1S2 noted,RRR, no murmurs, rubs, gallops RESPIRATORY:  Clear to auscultation without rales, wheezing or rhonchi  ABDOMEN: Soft, non-tender, non-distended, +bowel sounds, no guarding. EXTREMITIES: No edema, No cyanosis, no clubbing MUSCULOSKELETAL:  No edema; No deformity  SKIN: Warm and dry NEUROLOGIC:  Alert and oriented x  3, non-focal PSYCHIATRIC:  Normal affect, good insight  ASSESSMENT:    1. Mobitz type 2 second degree AV block   2. SVT (supraventricular tachycardia) ([place])   3. Type 2 diabetes mellitus without complication, without long-term current use of insulin ([place])   4. Hyperlipidemia associated with type 2 diabetes mellitus (HCC)   5. Obesity (BMI 35.0-39.9 without comorbidity)   6. Tobacco use disorder   7. Mixed hyperlipidemia    PLAN:     Her monitor showed evidence of second-degree/high-grade AV block.  But she is on metoprolol succinate 25 mg daily for her paroxysmal atrial tachycardia.The medication is improving her symptoms with from her paroxysmal atrial tachycardia.  However in the setting of her second-degree AV block I am going to decrease this medication to 12.5mg  daily.    As I decreased her beta-blocker, I have educated patient to notify my office of any lightheadedness or dizziness.  Should she develop symptoms or dizziness and continue to have pauses on this lower dose she may need to be evaluated for possible EP study to understand her atrial tachycardia if ablation is a option or she may need to be evaluated for possible pacemaker. Therefore I am going to refer the patient my  EP colleagues for initial evaluation.  Her lab work was discussed with her, she will continue her same dose of statin for now and will repeat in 3 months.  The patient was counseled on tobacco cessation today for 5 minutes.  Counseling included reviewing the risks of smoking tobacco products, how it impacts the patient's current medical diagnoses and different strategies for quitting.  Pharmacotherapy to aid in tobacco cessation was not prescribed today. The patient coordinate with  primary care provider.  The patient was also advised to call   1-800-QUIT-NOW 825-828-0862) for additional help with quitting smoking.  The patient understands the need to lose weight with diet and exercise. We have discussed  specific strategies for this.  The patient is in agreement with the above plan. The patient left the office in stable condition.  The patient will follow up in 3 months.   Medication Adjustments/Labs and Tests Ordered: Current medicines are reviewed at length with the patient today.  Concerns regarding medicines are outlined above.  Orders Placed This Encounter  Procedures  . Ambulatory referral to Cardiac Electrophysiology   Meds ordered this encounter  Medications  . metoprolol succinate (TOPROL-XL) 25 MG 24 hr tablet    Sig: Take  0.5 tablets (12.5 mg total) by mouth daily.    Patient Instructions  Medication Instructions:  Your physician has recommended you make the following change in your medication:  DECREASE metoprolol succinate (toprol-XL) 25 mg: Take 0.5 tablet (12.5 mg) daily   *If you need a refill on your cardiac medications before your next appointment, please call your pharmacy*  Lab Work: None  If you have labs (blood work) drawn today and your tests are completely normal, you will receive your results only by: Marland Kitchen MyChart Message (if you have MyChart) OR . A paper copy in the mail If you have any lab test that is abnormal or we need to change your treatment, we will call you to review the results.  Testing/Procedures: You have been referred to see an electrophysiologist, Dr. Curt Bears, in the [facility] office. You will be scheduled for an appointment at the front desk.   Follow-Up: At [facility], you and your health needs are our priority.  As part of our continuing mission to provide you with exceptional heart care, we have created designated Provider Care Teams.  These Care Teams include your primary Cardiologist (physician) and Advanced Practice Providers (APPs -  Physician Assistants and Nurse Practitioners) who all work together to provide you with the care you need, when you need it.  Your next appointment:   3 months  The format for your next  appointment:   In Person  Provider:   Berniece Salines, DO        Adopting a Healthy Lifestyle.  Know what a healthy weight is for you (roughly BMI <25) and aim to maintain this   Aim for 7+ servings of fruits and vegetables daily   65-80+ fluid ounces of water or unsweet tea for healthy kidneys   Limit to max 1 drink of alcohol per day; avoid smoking/tobacco   Limit animal fats in diet for cholesterol and heart health - choose grass fed whenever available   Avoid highly processed foods, and foods high in saturated/trans fats   Aim for low stress - take time to unwind and care for your mental health   Aim for 150 min of moderate intensity exercise weekly for heart health, and weights twice weekly for bone health   Aim for 7-9 hours of sleep daily   When it comes to diets, agreement about the perfect plan isnt easy to find, even among the experts. Experts at the [place] developed an idea known as the Healthy Eating Plate. Just imagine a plate divided into logical, healthy portions.   The emphasis is on diet quality:   Load up on vegetables and fruits - one-half of your plate: Aim for color and variety, and remember that potatoes dont count.   Go for whole grains - one-quarter of your plate: Whole wheat, barley, wheat berries, quinoa, oats, brown rice, and foods made with them. If you want pasta, go with whole wheat pasta.   Protein power - one-quarter of your plate: Fish, chicken, beans, and nuts are all healthy, versatile protein sources. Limit red meat.   The diet, however, does go beyond the plate, offering a few other suggestions.   Use healthy plant oils, such as olive, canola, soy, corn, sunflower and peanut. Check the labels, and avoid partially hydrogenated oil, which have unhealthy trans fats.   If youre thirsty, drink water. Coffee and tea are good in moderation, but skip sugary drinks and limit milk and dairy products to one or two  daily  servings.   The type of carbohydrate in the diet is more important than the amount. Some sources of carbohydrates, such as vegetables, fruits, whole grains, and beans-are healthier than others.   Finally, stay active  Signed, Berniece Salines, DO  11/29/2018 7:13 PM    [facility]

## 2018-12-03 ENCOUNTER — Telehealth: Payer: Self-pay | Admitting: *Deleted

## 2018-12-03 NOTE — Telephone Encounter (Signed)
Pt needs refill of Metformin 500 mg, qd, [facility] in [place]. Pt does not have a PCP and wondered if we could refill this one time until she can get established with someone?

## 2018-12-04 NOTE — Telephone Encounter (Signed)
Spoke with Dr Harriet Masson who states she can not fill anything but her cardiac meds and recommends [place] as a possible PCP. Telephone call to patient . Left message with husband regarding  above information. He states they will contact them for an appointment.

## 2019-01-06 ENCOUNTER — Ambulatory Visit (INDEPENDENT_AMBULATORY_CARE_PROVIDER_SITE_OTHER): Payer: BC Managed Care – PPO | Admitting: Cardiology

## 2019-01-06 ENCOUNTER — Encounter: Payer: Self-pay | Admitting: Cardiology

## 2019-01-06 ENCOUNTER — Other Ambulatory Visit: Payer: Self-pay

## 2019-01-06 VITALS — BP 104/68 | HR 95 | Ht 69.0 in | Wt 228.0 lb

## 2019-01-06 DIAGNOSIS — I441 Atrioventricular block, second degree: Secondary | ICD-10-CM

## 2019-01-06 DIAGNOSIS — R002 Palpitations: Secondary | ICD-10-CM | POA: Diagnosis not present

## 2019-01-06 MED ORDER — METOPROLOL SUCCINATE ER 25 MG PO TB24: 25.0000 mg | ORAL_TABLET | Freq: Every day | ORAL | 3 refills | Status: DC

## 2019-01-06 NOTE — Patient Instructions (Addendum)
Medication Instructions:  Your physician has recommended you make the following change in your medication:  1. INCREASE Metoprolol Succinate (Toprol) to 25 mg daily  * If you need a refill on your cardiac medications before your next appointment, please call your pharmacy.   Labwork: None ordered  Testing/Procedures: None ordered  Follow-Up: Keep you follow up with Dr. Harriet Masson next month.    Thank you for choosing [facility]!!   Trinidad Curet, RN [phone]

## 2019-01-06 NOTE — Progress Notes (Signed)
Electrophysiology Office Note   Date:  01/06/2019   ID:  Christina [name], DOB [birth year], MRN [id number]  PCP:  Berniece Salines, DO  Cardiologist:  Tobb Primary Electrophysiologist:  Kaelum Kissick Meredith Leeds, MD    Chief Complaint: SVT   History of Present Illness: Christina [name] is a 43 y.o. female who is being seen today for the evaluation of SVT at the request of Tobb, Kardie, DO. Presenting today for electrophysiology evaluation.  He has a history of diabetes, hyperlipidemia, and SVT.  She continues to have intermittent palpitations despite low-dose metoprolol.  She was diagnosed with paroxysmal atrial tachycardia, but her metoprolol dose was decreased due to second-degree AV block.  Today, she denies symptoms of chest pain, shortness of breath, orthopnea, PND, lower extremity edema, claudication, dizziness, presyncope, syncope, bleeding, or neurologic sequela. The patient is tolerating medications without difficulties.  Since her metoprolol dose was decreased, she has had more episodes of palpitations.  She says that they occur on a daily basis.  No exacerbating or alleviating factors.  She brings in her iPhone monitor which shows heart rates of up to 200 bpm.  Of note, her apple watch does not fit closely to her skin and thus these are potentially not accurate recordings.   Past Medical History:  Diagnosis Date  . Diabetes mellitus without complication ([place])   . Hyperlipidemia   . SVT (supraventricular tachycardia) (HCC)    Past Surgical History:  Procedure Laterality Date  . ABDOMINAL HYSTERECTOMY    . APPENDECTOMY    . TONSILLECTOMY       Current Outpatient Medications  Medication Sig Dispense Refill  . atorvastatin (LIPITOR) 40 MG tablet Take 1 tablet (40 mg total) by mouth daily. 90 tablet 3  . metFORMIN (GLUCOPHAGE) 500 MG tablet Take 500 mg by mouth daily.    . metoprolol succinate (TOPROL XL) 25 MG 24 hr tablet Take 1 tablet (25 mg total) by mouth daily. 30 tablet 3   No  current facility-administered medications for this visit.     Allergies:   Patient has no known allergies.   Social History:  The patient  reports that she has been smoking cigarettes. She has a 15.00 pack-year smoking history. She has never used smokeless tobacco. She reports that she does not drink alcohol or use drugs.   Family History:  The patient's family history includes Breast cancer (age of onset: 19) in her maternal aunt; Breast cancer (age of onset: 66) in her maternal aunt; Cancer in her mother; Colon cancer (age of onset: 69) in her mother; Heart attack in her father; Heart disease in her father and mother; Ovarian cancer (age of onset: 64) in her mother.    ROS:  Please see the history of present illness.   Otherwise, review of systems is positive for none.   All other systems are reviewed and negative.    PHYSICAL EXAM: VS:  BP 104/68   Pulse 95   Ht 5\' 9"  (1.753 m)   Wt 228 lb (103.4 kg)   SpO2 98%   BMI 33.67 kg/m  , BMI Body mass index is 33.67 kg/m. GEN: Well nourished, well developed, in no acute distress  HEENT: normal  Neck: no JVD, carotid bruits, or masses Cardiac: RRR; no murmurs, rubs, or gallops,no edema  Respiratory:  clear to auscultation bilaterally, normal work of breathing GI: soft, nontender, nondistended, + BS MS: no deformity or atrophy  Skin: warm and dry Neuro:  Strength and sensation are intact Psych: euthymic  mood, full affect  EKG:  EKG is ordered today. Personal review of the ekg ordered shows sinus rhythm, rate 95  Recent Labs: 11/27/2018: BUN 8; Creatinine, Ser 0.75; Magnesium 2.0; Potassium 4.3; Sodium 137    Lipid Panel     Component Value Date/Time   CHOL 192 11/27/2018 0931   TRIG 145 11/27/2018 0931   HDL 38 (L) 11/27/2018 0931   CHOLHDL 5.1 (H) 11/27/2018 0931   LDLCALC 128 (H) 11/27/2018 0931     Wt Readings from Last 3 Encounters:  01/06/19 228 lb (103.4 kg)  11/29/18 228 lb (103.4 kg)  10/24/18 227 lb (103 kg)       Other studies Reviewed: Additional studies/ records that were reviewed today include: TTE 11/27/18  Review of the above records today demonstrates:   1. Left ventricular ejection fraction, by visual estimation, is 55 to 60%. The left ventricle has normal function. Normal left ventricular size. There is no left ventricular hypertrophy. Normal Diastolic Function.  2. Global right ventricle has normal systolic function.The right ventricular size is normal. No increase in right ventricular wall thickness.  3. Left atrial size was normal.  4. Right atrial size was normal.  5. The mitral valve is normal in structure. No evidence of mitral valve regurgitation. No evidence of mitral stenosis.  6. The tricuspid valve is normal in structure. Tricuspid valve regurgitation is trivial.  7. The aortic valve is normal in structure. Aortic valve regurgitation was not visualized by color flow Doppler. Structurally normal aortic valve, with no evidence of sclerosis or stenosis.  8. The pulmonic valve was not well visualized. Pulmonic valve regurgitation is not visualized by color flow Doppler.  9. The aorta is normal size. No coarctation of the aorta by color doppler. 10. Normal pulmonary artery systolic pressure.  Zio 11/26/18 personally reviewed Minimum HR 22 bpm with maximum HR 144 bpm and average HR 89 bpm. Predominant underlying rhythm was Sinus Rhythm.  1 run of Supraventricular Tachycardia occurred lasting 24.5 secs with a max rate of 144 bpm (avg 111 bpm). 1 episode of AV Block (2nd/High Grade) occurred, lasting a total of 10 secs. Premature atrial complexes were rare (<1.0%) Premature Ventricular complexes were rare (<1.0%). No Ventricular tachycardia, no atrial fibrillation. No patient triggered events or diaries noted.   ASSESSMENT AND PLAN:  1.  Palpitations: Patient currently on metoprolol.  Her dose was cut back due to her Mobitz 2 AV block.  This appeared to be nocturnal and thus  Jamine Wingate increase her metoprolol back to 25 mg.  2.  Mobitz 2 AV block: Had one episode that occurred during sleeping hours.  At this point, no further therapy is needed.  If she needs increased doses of metoprolol, it would be acceptable if she remains asymptomatic.  This was likely due to a vagal event as she had some slight PR interval changes prior to her block.  Current medicines are reviewed at length with the patient today.   The patient does not have concerns regarding her medicines.  The following changes were made today: Increase metoprolol  Labs/ tests ordered today include:  Orders Placed This Encounter  Procedures  . EKG 12-Lead   Case discussed with referring cardiologist  Disposition:   FU with Awais Cobarrubias As needed  Signed, Blanka Rockholt Meredith Leeds, MD  01/06/2019 2:57 PM     [address] [place] [address] [phone] (office) [phone] (fax)

## 2019-01-23 ENCOUNTER — Telehealth: Payer: Self-pay

## 2019-01-23 ENCOUNTER — Ambulatory Visit: Payer: No Typology Code available for payment source | Admitting: Cardiology

## 2019-01-23 NOTE — Telephone Encounter (Signed)
Patient came to wrong office for appt with Dr. Harriet Masson this morning, office unable to accommodate changing appt time today. Phoned patient and rescheduled follow-up visit with Dr. Harriet Masson on 02/10/19 at 1120.

## 2019-02-10 ENCOUNTER — Ambulatory Visit: Payer: BC Managed Care – PPO | Admitting: Cardiology

## 2019-02-25 ENCOUNTER — Encounter: Payer: Self-pay | Admitting: Cardiology

## 2019-02-25 ENCOUNTER — Other Ambulatory Visit: Payer: Self-pay

## 2019-02-25 ENCOUNTER — Ambulatory Visit (INDEPENDENT_AMBULATORY_CARE_PROVIDER_SITE_OTHER): Payer: BC Managed Care – PPO | Admitting: Cardiology

## 2019-02-25 VITALS — BP 102/64 | HR 94 | Ht 69.0 in | Wt 236.0 lb

## 2019-02-25 DIAGNOSIS — I471 Supraventricular tachycardia: Secondary | ICD-10-CM

## 2019-02-25 DIAGNOSIS — E1169 Type 2 diabetes mellitus with other specified complication: Secondary | ICD-10-CM | POA: Diagnosis not present

## 2019-02-25 DIAGNOSIS — E782 Mixed hyperlipidemia: Secondary | ICD-10-CM

## 2019-02-25 DIAGNOSIS — E785 Hyperlipidemia, unspecified: Secondary | ICD-10-CM

## 2019-02-25 DIAGNOSIS — E119 Type 2 diabetes mellitus without complications: Secondary | ICD-10-CM

## 2019-02-25 NOTE — Progress Notes (Signed)
Cardiology Office Note:    Date:  02/25/2019   ID:  Christina [name], DOB 01/18/1976, MRN [id number]  PCP:  Berniece Salines, DO  Cardiologist:  Berniece Salines, DO  Electrophysiologist:  None   Referring MD: Berniece Salines, DO    follow up for SVT  History of Present Illness:    Christina [name] is a 44 y.o. female with a hx of SVT diagnosed earlier this year she was started on metoprolol succinate 25 mg,, DMII on metformin, HLN and recently diagnosed [place].  I did see the patient October 24, 2018 at which time her establishing cardiac care.  During her initial visit even though she was on the 25 mg of metoprolol succinate she still is experiencing more palpitations.  Giving her palpitations on beta-blocker Zio patch was placed on patient to see how well this medication was controlling her pain. In the interim the patient did wear the  Zio patch.  She also was on a transthoracic echocardiogram which she completed  I did see the patient November 29, 2018 at that time we will discuss her monitor report with her which show evidence of Mobitz type II second-degree AV block.  Cut back on her beta-blocker and have the patient see EP.  In the interim she was able to see EP who thought that this was all nocturnal therefore it was okay for the patient return on her metoprolol 25 mg a day.  She tells me that she has been doing well and that her palpitations has improved.  No other complaints at this time.  She is still waiting on setting up an appointment for primary care provider.  Past Medical History:  Diagnosis Date  . Diabetes mellitus without complication ([place])   . Hyperlipidemia   . SVT (supraventricular tachycardia) ([facility])     Past Surgical History:  Procedure Laterality Date  . ABDOMINAL HYSTERECTOMY    . APPENDECTOMY    . TONSILLECTOMY      Current Medications: Current Meds  Medication Sig  . atorvastatin (LIPITOR) 40 MG tablet Take 1 tablet (40 mg total) by mouth daily.  . metoprolol  succinate (TOPROL XL) 25 MG 24 hr tablet Take 1 tablet (25 mg total) by mouth daily.     Allergies:   Patient has no known allergies.   Social History   Socioeconomic History  . Marital status: Married    Spouse name: Not on file  . Number of children: Not on file  . Years of education: Not on file  . Highest education level: Not on file  Occupational History  . Not on file  Tobacco Use  . Smoking status: Current Every Day Smoker    Packs/day: 1.00    Years: 15.00    Pack years: 15.00    Types: Cigarettes  . Smokeless tobacco: Never Used  Substance and Sexual Activity  . Alcohol use: No  . Drug use: No  . Sexual activity: Yes    Birth control/protection: None  Other Topics Concern  . Not on file  Social History Narrative  . Not on file   Social Determinants of Health   Financial Resource Strain:   . Difficulty of Paying Living Expenses: Not on file  Food Insecurity:   . Worried About Charity fundraiser in the Last Year: Not on file  . Ran Out of Food in the Last Year: Not on file  Transportation Needs:   . Lack of Transportation (Medical): Not on file  . Lack of  Transportation (Non-Medical): Not on file  Physical Activity:   . Days of Exercise per Week: Not on file  . Minutes of Exercise per Session: Not on file  Stress:   . Feeling of Stress : Not on file  Social Connections:   . Frequency of Communication with Friends and Family: Not on file  . Frequency of Social Gatherings with Friends and Family: Not on file  . Attends Religious Services: Not on file  . Active Member of Clubs or Organizations: Not on file  . Attends Archivist Meetings: Not on file  . Marital Status: Not on file     Family History: The patient's family history includes Breast cancer (age of onset: 14) in her maternal aunt; Breast cancer (age of onset: 7) in her maternal aunt; Cancer in her mother; Colon cancer (age of onset: 37) in her mother; Heart attack in her father; Heart  disease in her father and mother; Ovarian cancer (age of onset: 8) in her mother.  ROS:   Review of Systems  Constitution: Negative for decreased appetite, fever and weight gain.  HENT: Negative for congestion, ear discharge, hoarse voice and sore throat.   Eyes: Negative for discharge, redness, vision loss in right eye and visual halos.  Cardiovascular: Negative for chest pain, dyspnea on exertion, leg swelling, orthopnea and palpitations.  Respiratory: Negative for cough, hemoptysis, shortness of breath and snoring.   Endocrine: Negative for heat intolerance and polyphagia.  Hematologic/Lymphatic: Negative for bleeding problem. Does not bruise/bleed easily.  Skin: Negative for flushing, nail changes, rash and suspicious lesions.  Musculoskeletal: Negative for arthritis, joint pain, muscle cramps, myalgias, neck pain and stiffness.  Gastrointestinal: Negative for abdominal pain, bowel incontinence, diarrhea and excessive appetite.  Genitourinary: Negative for decreased libido, genital sores and incomplete emptying.  Neurological: Negative for brief paralysis, focal weakness, headaches and loss of balance.  Psychiatric/Behavioral: Negative for altered mental status, depression and suicidal ideas.  Allergic/Immunologic: Negative for HIV exposure and persistent infections.    EKGs/Labs/Other Studies Reviewed:    The following studies were reviewed today:   EKG: None today   Other studies Reviewed: Additional studies/ records that were reviewed today include: TTE 11/27/18  Review of the above records today demonstrates:  1. Left ventricular ejection fraction, by visual estimation, is 55 to 60%. The left ventricle has normal function. Normal left ventricular size. There is no left ventricular hypertrophy. Normal Diastolic Function. 2. Global right ventricle has normal systolic function.The right ventricular size is normal. No increase in right ventricular wall thickness. 3. Left  atrial size was normal. 4. Right atrial size was normal. 5. The mitral valve is normal in structure. No evidence of mitral valve regurgitation. No evidence of mitral stenosis. 6. The tricuspid valve is normal in structure. Tricuspid valve regurgitation is trivial. 7. The aortic valve is normal in structure. Aortic valve regurgitation was not visualized by color flow Doppler. Structurally normal aortic valve, with no evidence of sclerosis or stenosis. 8. The pulmonic valve was not well visualized. Pulmonic valve regurgitation is not visualized by color flow Doppler. 9. The aorta is normal size. No coarctation of the aorta by color doppler. 10. Normal pulmonary artery systolic pressure.  Zio 11/26/18 personally reviewed Minimum HR 22 bpm with maximum HR 144 bpm and average HR 89 bpm. Predominant underlying rhythm was Sinus Rhythm.  1 run of Supraventricular Tachycardia occurred lasting 24.5 secs with a max rate of 144 bpm (avg 111 bpm). 1 episode of AV Block (2nd/High Grade)  occurred, lasting a total of 10 secs. Premature atrial complexes were rare (<1.0%) Premature Ventricular complexes were rare (<1.0%). No Ventricular tachycardia, no atrial fibrillation. No patient triggered events or diaries noted.  Recent Labs: 11/27/2018: BUN 8; Creatinine, Ser 0.75; Magnesium 2.0; Potassium 4.3; Sodium 137  Recent Lipid Panel    Component Value Date/Time   CHOL 192 11/27/2018 0931   TRIG 145 11/27/2018 0931   HDL 38 (L) 11/27/2018 0931   CHOLHDL 5.1 (H) 11/27/2018 0931   LDLCALC 128 (H) 11/27/2018 0931    Physical Exam:    VS:  BP 102/64   Pulse 94   Ht 5\' 9"  (1.753 m)   Wt 236 lb (107 kg)   SpO2 98%   BMI 34.85 kg/m     Wt Readings from Last 3 Encounters:  02/25/19 236 lb (107 kg)  01/06/19 228 lb (103.4 kg)  11/29/18 228 lb (103.4 kg)     GEN: Well nourished, well developed in no acute distress HEENT: Normal NECK: No JVD; No carotid bruits LYMPHATICS: No  lymphadenopathy CARDIAC: S1S2 noted,RRR, no murmurs, rubs, gallops RESPIRATORY:  Clear to auscultation without rales, wheezing or rhonchi  ABDOMEN: Soft, non-tender, non-distended, +bowel sounds, no guarding. EXTREMITIES: No edema, No cyanosis, no clubbing MUSCULOSKELETAL:  No edema; No deformity  SKIN: Warm and dry NEUROLOGIC:  Alert and oriented x 3, non-focal PSYCHIATRIC:  Normal affect, good insight  ASSESSMENT:    1. SVT (supraventricular tachycardia) ([place])   2. Type 2 diabetes mellitus without complication, without long-term current use of insulin ([place])   3. Hyperlipidemia associated with type 2 diabetes mellitus ([place])   4. Mixed hyperlipidemia    PLAN:    1.  He is to be doing well from a cardiovascular standpoint.  I will be no changes in her medication regimen today.  For this the patient follow-up in 6 months or sooner if needed.  The patient is in agreement with the above plan. The patient left the office in stable condition.  The patient will follow up in 6 months or sooner if needed.   Medication Adjustments/Labs and Tests Ordered: Current medicines are reviewed at length with the patient today.  Concerns regarding medicines are outlined above.  No orders of the defined types were placed in this encounter.  No orders of the defined types were placed in this encounter.   Patient Instructions  Medication Instructions:  Your physician recommends that you continue on your current medications as directed. Please refer to the Current Medication list given to you today.  *If you need a refill on your cardiac medications before your next appointment, please call your pharmacy*  Lab Work: None If you have labs (blood work) drawn today and your tests are completely normal, you will receive your results only by: Marland Kitchen MyChart Message (if you have MyChart) OR . A paper copy in the mail If you have any lab test that is abnormal or we need to change your treatment, we will call  you to review the results.  Testing/Procedures: None  Follow-Up: At [facility], you and your health needs are our priority.  As part of our continuing mission to provide you with exceptional heart care, we have created designated Provider Care Teams.  These Care Teams include your primary Cardiologist (physician) and Advanced Practice Providers (APPs -  Physician Assistants and Nurse Practitioners) who all work together to provide you with the care you need, when you need it.  Your next appointment:   6 month(s)  The format for your next appointment:   In Person  Provider:   Berniece Salines, DO  Other Instructions      Adopting a Healthy Lifestyle.  Know what a healthy weight is for you (roughly BMI <25) and aim to maintain this   Aim for 7+ servings of fruits and vegetables daily   65-80+ fluid ounces of water or unsweet tea for healthy kidneys   Limit to max 1 drink of alcohol per day; avoid smoking/tobacco   Limit animal fats in diet for cholesterol and heart health - choose grass fed whenever available   Avoid highly processed foods, and foods high in saturated/trans fats   Aim for low stress - take time to unwind and care for your mental health   Aim for 150 min of moderate intensity exercise weekly for heart health, and weights twice weekly for bone health   Aim for 7-9 hours of sleep daily   When it comes to diets, agreement about the perfect plan isnt easy to find, even among the experts. Experts at the [employer] developed an idea known as the Healthy Eating Plate. Just imagine a plate divided into logical, healthy portions.   The emphasis is on diet quality:   Load up on vegetables and fruits - one-half of your plate: Aim for color and variety, and remember that potatoes dont count.   Go for whole grains - one-quarter of your plate: Whole wheat, barley, wheat berries, quinoa, oats, brown rice, and foods made with them. If you want  pasta, go with whole wheat pasta.   Protein power - one-quarter of your plate: Fish, chicken, beans, and nuts are all healthy, versatile protein sources. Limit red meat.   The diet, however, does go beyond the plate, offering a few other suggestions.   Use healthy plant oils, such as olive, canola, soy, corn, sunflower and peanut. Check the labels, and avoid partially hydrogenated oil, which have unhealthy trans fats.   If youre thirsty, drink water. Coffee and tea are good in moderation, but skip sugary drinks and limit milk and dairy products to one or two daily servings.   The type of carbohydrate in the diet is more important than the amount. Some sources of carbohydrates, such as vegetables, fruits, whole grains, and beans-are healthier than others.   Finally, stay active  Signed, Berniece Salines, DO  02/25/2019 3:39 PM    [facility]

## 2019-02-25 NOTE — Patient Instructions (Signed)
Medication Instructions:  Your physician recommends that you continue on your current medications as directed. Please refer to the Current Medication list given to you today.  *If you need a refill on your cardiac medications before your next appointment, please call your pharmacy*  Lab Work: None If you have labs (blood work) drawn today and your tests are completely normal, you will receive your results only by: . MyChart Message (if you have MyChart) OR . A paper copy in the mail If you have any lab test that is abnormal or we need to change your treatment, we will call you to review the results.  Testing/Procedures: None  Follow-Up: At [facility], you and your health needs are our priority.  As part of our continuing mission to provide you with exceptional heart care, we have created designated Provider Care Teams.  These Care Teams include your primary Cardiologist (physician) and Advanced Practice Providers (APPs -  Physician Assistants and Nurse Practitioners) who all work together to provide you with the care you need, when you need it.  Your next appointment:   6 month(s)  The format for your next appointment:   In Person  Provider:   Kardie Tobb, DO  Other Instructions   

## 2019-05-05 ENCOUNTER — Other Ambulatory Visit: Payer: Self-pay | Admitting: Cardiology

## 2019-05-05 NOTE — Telephone Encounter (Signed)
*  STAT* If patient is at the pharmacy, call can be transferred to refill team.   1. Which medications need to be refilled? (please list name of each medication and dose if known) metoprolol succinate (TOPROL XL) 25 MG 24 hr tablet  2. Which pharmacy/location (including street and city if local pharmacy) is medication to be sent to? [facility] [place] [address]  3. Do they need a 30 day or 90 day supply? [place]

## 2019-05-06 MED ORDER — METOPROLOL SUCCINATE ER 25 MG PO TB24: 25.0000 mg | ORAL_TABLET | Freq: Every day | ORAL | 2 refills | Status: AC

## 2020-12-24 ENCOUNTER — Telehealth: Payer: Self-pay | Admitting: *Deleted

## 2020-12-24 NOTE — Telephone Encounter (Signed)
Called and left the patient a message to call the office back; patient needs to be scheduled for a new patient appt

## 2020-12-27 NOTE — Telephone Encounter (Signed)
Attempted to reach the patient to schedule an appt, left a message to call the office back to schedule an new patient appt

## 2020-12-28 ENCOUNTER — Encounter: Payer: Self-pay | Admitting: Gynecologic Oncology

## 2020-12-28 NOTE — Telephone Encounter (Signed)
Pt called office to schedule her appointment. Appointment scheduled for tomorrow. Pt aware of time and date.

## 2020-12-29 ENCOUNTER — Inpatient Hospital Stay (HOSPITAL_BASED_OUTPATIENT_CLINIC_OR_DEPARTMENT_OTHER): Payer: BC Managed Care – PPO | Admitting: Gynecologic Oncology

## 2020-12-29 ENCOUNTER — Other Ambulatory Visit: Payer: Self-pay

## 2020-12-29 ENCOUNTER — Inpatient Hospital Stay: Payer: BC Managed Care – PPO | Attending: Gynecologic Oncology | Admitting: Gynecologic Oncology

## 2020-12-29 ENCOUNTER — Inpatient Hospital Stay: Payer: BC Managed Care – PPO

## 2020-12-29 ENCOUNTER — Encounter: Payer: Self-pay | Admitting: Gynecologic Oncology

## 2020-12-29 ENCOUNTER — Other Ambulatory Visit: Payer: Self-pay | Admitting: Gynecologic Oncology

## 2020-12-29 VITALS — BP 141/100 | HR 98 | Temp 98.2°F | Resp 18 | Wt 226.3 lb

## 2020-12-29 DIAGNOSIS — R102 Pelvic and perineal pain: Secondary | ICD-10-CM

## 2020-12-29 DIAGNOSIS — Z90710 Acquired absence of both cervix and uterus: Secondary | ICD-10-CM | POA: Insufficient documentation

## 2020-12-29 DIAGNOSIS — F17210 Nicotine dependence, cigarettes, uncomplicated: Secondary | ICD-10-CM | POA: Diagnosis not present

## 2020-12-29 DIAGNOSIS — I471 Supraventricular tachycardia: Secondary | ICD-10-CM | POA: Diagnosis not present

## 2020-12-29 DIAGNOSIS — N751 Abscess of Bartholin's gland: Secondary | ICD-10-CM | POA: Diagnosis not present

## 2020-12-29 DIAGNOSIS — Z7984 Long term (current) use of oral hypoglycemic drugs: Secondary | ICD-10-CM | POA: Diagnosis not present

## 2020-12-29 DIAGNOSIS — Z79899 Other long term (current) drug therapy: Secondary | ICD-10-CM | POA: Insufficient documentation

## 2020-12-29 DIAGNOSIS — R52 Pain, unspecified: Secondary | ICD-10-CM

## 2020-12-29 DIAGNOSIS — C519 Malignant neoplasm of vulva, unspecified: Secondary | ICD-10-CM | POA: Insufficient documentation

## 2020-12-29 DIAGNOSIS — A630 Anogenital (venereal) warts: Secondary | ICD-10-CM | POA: Diagnosis not present

## 2020-12-29 MED ORDER — METRONIDAZOLE 500 MG PO TABS: 500.0000 mg | ORAL_TABLET | Freq: Two times a day (BID) | ORAL | 0 refills | Status: DC

## 2020-12-29 MED ORDER — SULFAMETHOXAZOLE-TRIMETHOPRIM 800-160 MG PO TABS
1.0000 | ORAL_TABLET | Freq: Two times a day (BID) | ORAL | 0 refills | Status: DC
Start: 2020-12-29 — End: 2021-01-03

## 2020-12-29 MED ORDER — SENNOSIDES-DOCUSATE SODIUM 8.6-50 MG PO TABS: 2.0000 | ORAL_TABLET | Freq: Every day | ORAL | 0 refills | Status: AC

## 2020-12-29 MED ORDER — OXYCODONE HCL 5 MG PO TABS: 5.0000 mg | ORAL_TABLET | ORAL | 0 refills | Status: DC | PRN

## 2020-12-29 MED ORDER — IBUPROFEN 800 MG PO TABS
800.0000 mg | ORAL_TABLET | Freq: Three times a day (TID) | ORAL | 0 refills | Status: DC | PRN
Start: 2020-12-29 — End: 2021-01-28

## 2020-12-29 NOTE — Patient Instructions (Addendum)
Please do 2-3 Sitzs baths a day for 10-20 minutes. Please take antibiotics prescribed today twice a day until surgery. Please start something, such as Miralax or Senakot, now for your bowel function.  Preparing for your Surgery  Plan for surgery on January 04, 2021 with Dr. Jeral Pinch at [facility] will be scheduled for a wide local excision of the vulva and bartholin cyst excision vs marsupialization.   You need to begin taking bactrim and flagyl antibiotics now to treat the vulvar abscess. You will need to avoid alcohol use while taking flagyl. We will obtain culture results today and will contact you if a change in antibiotics is needed.  Pre-operative Testing -You will receive a phone call from presurgical testing at [facility] to arrange for a pre-operative appointment and lab work.  -Bring your insurance card, copy of an advanced directive if applicable, medication list  -You should not be taking blood thinners or aspirin at least ten days prior to surgery unless instructed by your surgeon.  -Do not take supplements such as fish oil (omega 3), red yeast rice, turmeric before your surgery. You want to avoid medications with aspirin in them including headache powders such as BC or Goody's), Excedrin migraine.  Day Before Surgery at [place] will be advised you can have clear liquids up until 3 hours before your surgery.    Your role in recovery Your role is to become active as soon as directed by your doctor, while still giving yourself time to heal.  Rest when you feel tired. You will be asked to do the following in order to speed your recovery:  - Cough and breathe deeply. This helps to clear and expand your lungs and can prevent pneumonia after surgery.  - [place]. Do mild physical activity. Walking or moving your legs help your circulation and body functions return to normal. Do not try to get up or walk alone the first time  after surgery.   -If you develop swelling on one leg or the other, pain in the back of your leg, redness/warmth in one of your legs, please call the office or go to the Emergency Room to have a doppler to rule out a blood clot. For shortness of breath, chest pain-seek care in the Emergency Room as soon as possible. - Actively manage your pain. Managing your pain lets you move in comfort. We will ask you to rate your pain on a scale of zero to 10. It is your responsibility to tell your doctor or nurse where and how much you hurt so your pain can be treated.  Special Considerations -If you are diabetic, you may be placed on insulin after surgery to have closer control over your blood sugars to promote healing and recovery.  This does not mean that you will be discharged on insulin.  If applicable, your oral antidiabetics will be resumed when you are tolerating a solid diet.  -Your final pathology results from surgery should be available around one week after surgery and the results will be relayed to you when available.  -FMLA forms can be faxed to [phone] and please allow 5-7 business days for completion.  Pain Management After Surgery -You have been prescribed your pain medication and bowel regimen medications before surgery so that you can have these available when you are discharged from the hospital.   -Make sure that you have Tylenol and Ibuprofen at home to use on a regular  basis after surgery for pain control. We recommend alternating the medications every hour to six hours since they work differently and are processed in the body differently for pain relief.  -Review the attached handout on narcotic use and their risks and side effects.   Bowel Regimen -You have been prescribed Sennakot-S to take nightly to prevent constipation especially if you are taking the narcotic pain medication intermittently.  It is important to prevent constipation and drink adequate amounts of liquids. You  can stop taking this medication when you are not taking pain medication and you are back on your normal bowel routine.  Risks of Surgery Risks of surgery are low but include bleeding, infection, damage to surrounding structures, re-operation, blood clots, and very rarely death.  AFTER SURGERY INSTRUCTIONS  Return to work: 4-6 weeks if applicable  Activity: 1. Be up and out of the bed during the day.  Take a nap if needed.  You may walk up steps but be careful and use the hand rail.  Stair climbing will tire you more than you think, you may need to stop part way and rest.   2. No lifting or straining for 6 weeks over 10 pounds. No pushing, pulling, straining for 6 weeks.  3. Do not drive if you are taking narcotic pain medicine and make sure that your reaction time has returned.   4. You can shower as soon as the next day after surgery. Shower daily.  Use your regular soap and water (not directly on the incision) and pat your incision(s) dry afterwards; don't rub.    5. No sexual activity and nothing in the vagina for 8 weeks.  6. You may experience a small amount of clear drainage from your incision, which is normal.  If the drainage persists, increases, or changes color please call the office.  7. You may experience vaginal spotting after surgery or around the 6-8 week mark from surgery when the stitches at the top of the vagina begin to dissolve.  The spotting is normal but if you experience heavy bleeding, call our office.  8. Take Tylenol or ibuprofen first for pain and only use narcotic pain medication for severe pain not relieved by the Tylenol or Ibuprofen.  Monitor your Tylenol intake to a max of 4,000 mg in a 24 hour period. You can alternate these medications after surgery.  Diet: 1. Low sodium Heart Healthy Diet is recommended but you are cleared to resume your normal (before surgery) diet after your procedure.  2. It is safe to use a laxative, such as Miralax or Colace, if  you have difficulty moving your bowels. You have been prescribed Sennakot-S to take at bedtime every evening after surgery to keep bowel movements regular and to prevent constipation.  YOU CAN BEGIN THIS NOW AS WELL.  Wound Care: 1. Keep clean and dry.  Shower daily.  Reasons to call the Doctor: Fever - Oral temperature greater than 100.4 degrees Fahrenheit Foul-smelling vaginal discharge Difficulty urinating Nausea and vomiting Increased pain at the site of the incision that is unrelieved with pain medicine. Difficulty breathing with or without chest pain New calf pain especially if only on one side Sudden, continuing increased vaginal bleeding with or without clots.   Contacts: For questions or concerns you should contact:  Dr. Jeral Pinch at [phone]  [name], NP at [phone]  After Hours: call [phone] and have the GYN Oncologist paged/contacted (after 5 pm or on the weekends).  Messages sent via mychart  are for non-urgent matters and are not responded to after hours so for urgent needs, please call the after hours number.

## 2020-12-29 NOTE — Progress Notes (Signed)
GYNECOLOGIC ONCOLOGY NEW PATIENT CONSULTATION   Patient Name: Christina [name]  Patient Age: 45 y.o. Date of Service: 12/29/20 Referring Provider: Dr. 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  I discussed with the patient and her husband two distinct issues. 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 Based on my exam today, the lesion does not appear to be significantly invasive, but I reviewed that I cannot tell this on a microscopic level on exam. The lesion appears to emanate from the clitoris and completely replaces it. I reviewed that excision of the lesion would mean excision of the clitoris. We discussed the implications for sexual function with this surgery.   I think given her significant pain symptoms related to her first one abscess, it makes sense to proceed with surgery soon for her comfort. I will plan to perform a wide local excision of her and her vulva versus a partial modified radical vulvectomy. I discussed it based on final pathology, she may very well need lymph node assessment. Given the size of a lesion, this could be bilateral sentinel lymph node biopsy. 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 I encouraged her to decrease or stop smoking as this is a cofactor.  With regard to her Bartholin gland abscess, the patient is significantly tender on exam. She has fluctuance along the posterior left vagina in a location consistent with a Bartholin gland abscess. There is active drainage of purulent fluid. Culture was collected today. Patient was empirically started on Bactrim and Flagyl. Once culture  results, will change antibiotics if needed. She will benefit from several days of anabiotic therapy before surgery. Given that she has not had surgery before for this cyst/abscess, attempt will be made for marsupialization. 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 We also discussed the possibility of cyst excision.   Sitz baths were recommended.  Will plan for pap and speculum exam at the time of surgery.  We discussed plan for a pap, Bartholin's abscess marsupialization vs excision, WLE vs partial modified radical vulvectomy with clitorectomy. The risks of surgery were discussed in detail and she understands these to include infection; wound separation; injury to adjacent organs; bleeding which may require blood transfusion; anesthesia risk; thromboembolic events; possible death; unforeseen complications; possible need for re-exploration; medical complications such as heart attack, stroke, pleural effusion and pneumonia. The patient will receive DVT and antibiotic prophylaxis as indicated. She voiced a clear understanding. She had the opportunity to ask questions. Perioperative instructions were reviewed with her. Prescriptions for post-op medications were sent to her pharmacy of choice.  A copy of this note was sent to the patient's referring provider.   85 minutes of total time was spent for this patient encounter, including preparation, face-to-face counseling with the patient and coordination of care, and documentation of the encounter.   Jeral Pinch, MD  Division of Gynecologic Oncology  Department of Obstetrics and Gynecology  [facility]  ___________________________________________  Chief Complaint: Chief  Complaint  Patient presents with   Vulvar cancer ([place])    History of Present Illness:  Christina  [name] is a 45 y.o. y.o. female who is seen in consultation at the request of Dr. 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  Per her report, she has been treated at multiple emergency departments for the symptoms.  More recently, she has seen her gynecologist multiple times in the last several months for recurrent symptoms.  She describes this as pain along the left posterior aspect of her vaginal opening, similar in location and severity to when she has had symptoms before.  Her most recent symptoms started 2 weeks ago.  She has had cysts/abscesses lanced before and been on multiple rounds of antibiotics.  She historically will have improvement with drainage and antibiotic therapy and then will have recurrence of the cyst 2 to 4 weeks later.  She notes having difficulty moving and finding comfortable position to sit in secondary to this pain.  She has been off of narcotics now for 2 days and is using Tylenol and ibuprofen with minimal relief.  Has had about 2 weeks of drainage from her abscess.  At her most recent visit on 11/2, patient reported significant itching and an area of increased growth around her clitoris.  This was biopsied with pathology revealing squamous cell carcinoma, at least superficially invasive.  P16 immunostaining is strongly positive.  With regard to her vulvar cancer, patient denies any symptoms related to her anterior vulva other than some mild burning after she urinates, mild pruritus that has been stable.  She endorses a good appetite without nausea or emesis.  She and her husband are working on weight loss and eating a Mediterranean diet.  She notes some constipation since being on narcotics.  Her medical history is notable for metformin, last  hemoglobin A1c was 7.1% in September.  She has SVT with intermittent symptoms (can go up to 4 months without symptoms and sometimes as frequent symptoms).  She has a history of hyperlipidemia but her Lipitor was recently discontinued because her lipids had normalized.  In terms of surgical history, she had a hysterectomy in 2013 with a bladder sling.  She also has had an appendectomy and notes having to have another surgery for postoperative abscess drainage.  She also describes having been seen in the emergency department within the past couple of years and being told that she has an appendiceal stump that may need to be removed at some point.  Patient and her husband lived in [place].  They are currently in the [place] for her husband's job.  She reports half to three fourths of a pack of cigarette use per day.  PAST MEDICAL HISTORY:  Past Medical History:  Diagnosis Date   Diabetes mellitus without complication (HCC)    Hyperlipidemia    SVT (supraventricular tachycardia) ([place])      PAST SURGICAL HISTORY:  Past Surgical History:  Procedure Laterality Date   ABDOMINAL HYSTERECTOMY     APPENDECTOMY     TONSILLECTOMY      OB/GYN HISTORY:  OB History  Gravida Para Term Preterm AB Living  6 5          SAB IAB Ectopic Multiple Live Births               #  Outcome Date GA Lbr Len/2nd Weight Sex Delivery Anes PTL Lv  6 Gravida           5 Para           4 Para           3 Para           2 Para           1 Para             No LMP recorded. Patient has had a hysterectomy.  Age at menarche: 30 Age at menopause: No menses after her hysterectomy at age 38.  She notes that this was for precancerous cells found after a Pap smear.  She does not remember having any sort of excisional procedure.  She thinks that she has had normal Pap smears since. Hx of HRT: Denies Hx of STDs: Denies Last pap: Patient thinks she had one this year, I do not see 1 in care everywhere or the records  that we received History of abnormal pap smears: Yes see above  SCREENING STUDIES:  Last mammogram: 2019  Last colonoscopy: Has never had  MEDICATIONS: Outpatient Encounter Medications as of 12/29/2020  Medication Sig   ibuprofen (ADVIL) 800 MG tablet Take 1 tablet (800 mg total) by mouth every 8 (eight) hours as needed for moderate pain. For AFTER surgery   metFORMIN (GLUCOPHAGE) 500 MG tablet Take 500 mg by mouth daily.   metoprolol succinate (TOPROL XL) 25 MG 24 hr tablet Take 1 tablet (25 mg total) by mouth daily.   metroNIDAZOLE (FLAGYL) 500 MG tablet Take 1 tablet (500 mg total) by mouth 2 (two) times daily for 7 days.   oxyCODONE (OXY IR/ROXICODONE) 5 MG immediate release tablet Take 1 tablet (5 mg total) by mouth every 4 (four) hours as needed for severe pain. Do not take and drive   senna-docusate (SENOKOT-S) 8.6-50 MG tablet Take 2 tablets by mouth at bedtime. Do not take if having diarrhea   sulfamethoxazole-trimethoprim (BACTRIM DS) 800-160 MG tablet Take 1 tablet by mouth 2 (two) times daily for 7 days.   [EXPIRED] HYDROcodone-acetaminophen (NORCO/VICODIN) 5-325 MG tablet Take by mouth.   [DISCONTINUED] acetaminophen-codeine (TYLENOL #3) 300-30 MG tablet Take 1 tablet by mouth every 6 (six) hours.   [DISCONTINUED] atorvastatin (LIPITOR) 40 MG tablet Take 1 tablet (40 mg total) by mouth daily.   No facility-administered encounter medications on file as of 12/29/2020.    ALLERGIES:  No Known Allergies   FAMILY HISTORY:  Family History  Problem Relation Age of Onset   Cancer Mother    Ovarian cancer Mother 54   Colon cancer Mother 100   Heart disease Mother    Heart disease Father    Heart attack Father    Breast cancer Maternal Aunt 41   Breast cancer Maternal Aunt 47     SOCIAL HISTORY:  Social Connections: Not on file    REVIEW OF SYSTEMS:  Pertinent positives include dysuria, pelvic pain, vaginal bleeding, vaginal discharge, difficulty walking. Denies  appetite changes, fevers, chills, fatigue, unexplained weight changes. Denies hearing loss, neck lumps or masses, mouth sores, ringing in ears or voice changes. Denies cough or wheezing.  Denies shortness of breath. Denies chest pain or palpitations. Denies leg swelling. Denies abdominal distention, pain, blood in stools, constipation, diarrhea, nausea, vomiting, or early satiety. Denies pain with intercourse, frequency, hematuria or incontinence. Denies hot flashes.   Denies joint pain, back pain or muscle pain/cramps. Denies  itching, rash, or wounds. Denies dizziness, headaches, numbness or seizures. Denies swollen lymph nodes or glands, denies easy bruising or bleeding. Denies anxiety, depression, confusion, or decreased concentration.  Physical Exam:  Vital Signs for this encounter:  Blood pressure (!) 141/100, pulse 98, temperature 98.2 F (36.8 C), temperature source Oral, resp. rate 18, weight 226 lb 5 oz (102.7 kg), SpO2 99 %. Body mass index is 33.42 kg/m. General: Alert, oriented, no acute distress.  HEENT: Normocephalic, atraumatic. Sclera anicteric.  Chest: Clear to auscultation bilaterally. No wheezes, rhonchi, or rales. Cardiovascular: Regular rate and rhythm, no murmurs, rubs, or gallops.  Abdomen: Obese. Normoactive bowel sounds. Soft, nondistended, nontender to palpation. No masses or hepatosplenomegaly appreciated. No palpable fluid wave.  Extremities: Grossly normal range of motion. Warm, well perfused. No edema bilaterally.  Skin: No rashes or lesions.  Lymphatics: No cervical, supraclavicular, or inguinal adenopathy.  GU: External female genitalia notable for a mass that is approximately 3 cm that appears to be emanating from an replaces the clitoris and that extends down bilateral labia minora.  There does not appear to be deep invasion.  This area is quite mobile with limited tenderness on palpation.  The lesion itself is multiple centimeters away from the urethra.   Along the posterior left vaginal introitus, there is a 2-3 cm area of fluctuance that is very painful to palpation with purulent drainage noted.  Patient did not tolerate either a speculum or internal exam.  No masses or fluctuance appreciated on rectal exam.  LABORATORY AND RADIOLOGIC DATA:  Outside medical records were reviewed to synthesize the above history, along with the history and physical obtained during the visit.   Lab Results  Component Value Date   WBC 12.1 (H) 04/04/2017   HGB 15.8 04/04/2017   HCT 45.6 04/04/2017   PLT 332 04/04/2017   GLUCOSE 150 (H) 11/27/2018   CHOL 192 11/27/2018   TRIG 145 11/27/2018   HDL 38 (L) 11/27/2018   LDLCALC 128 (H) 11/27/2018   ALT 25 04/04/2017   AST 18 04/04/2017   NA 137 11/27/2018   K 4.3 11/27/2018   CL 103 11/27/2018   CREATININE 0.75 11/27/2018   BUN 8 11/27/2018   CO2 19 (L) 11/27/2018   HGBA1C 6.7 (H) 11/27/2018

## 2020-12-29 NOTE — H&P (View-Only) (Signed)
GYNECOLOGIC ONCOLOGY NEW PATIENT CONSULTATION   Patient Name: Christina [name]  Patient Age: 45 y.o. Date of Service: 12/29/20 Referring Provider: Dr. 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  I discussed with the patient and her husband two distinct issues. 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 Based on my exam today, the lesion does not appear to be significantly invasive, but I reviewed that I cannot tell this on a microscopic level on exam. The lesion appears to emanate from the clitoris and completely replaces it. I reviewed that excision of the lesion would mean excision of the clitoris. We discussed the implications for sexual function with this surgery.   I think given her significant pain symptoms related to her first one abscess, it makes sense to proceed with surgery soon for her comfort. I will plan to perform a wide local excision of her and her vulva versus a partial modified radical vulvectomy. I discussed it based on final pathology, she may very well need lymph node assessment. Given the size of a lesion, this could be bilateral sentinel lymph node biopsy. 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 I encouraged her to decrease or stop smoking as this is a cofactor.  With regard to her Bartholin gland abscess, the patient is significantly tender on exam. She has fluctuance along the posterior left vagina in a location consistent with a Bartholin gland abscess. There is active drainage of purulent fluid. Culture was collected today. Patient was empirically started on Bactrim and Flagyl. Once culture  results, will change antibiotics if needed. She will benefit from several days of anabiotic therapy before surgery. Given that she has not had surgery before for this cyst/abscess, attempt will be made for marsupialization. 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 We also discussed the possibility of cyst excision.   Sitz baths were recommended.  Will plan for pap and speculum exam at the time of surgery.  We discussed plan for a pap, Bartholin's abscess marsupialization vs excision, WLE vs partial modified radical vulvectomy with clitorectomy. The risks of surgery were discussed in detail and she understands these to include infection; wound separation; injury to adjacent organs; bleeding which may require blood transfusion; anesthesia risk; thromboembolic events; possible death; unforeseen complications; possible need for re-exploration; medical complications such as heart attack, stroke, pleural effusion and pneumonia. The patient will receive DVT and antibiotic prophylaxis as indicated. She voiced a clear understanding. She had the opportunity to ask questions. Perioperative instructions were reviewed with her. Prescriptions for post-op medications were sent to her pharmacy of choice.  A copy of this note was sent to the patient's referring provider.   85 minutes of total time was spent for this patient encounter, including preparation, face-to-face counseling with the patient and coordination of care, and documentation of the encounter.   Jeral Pinch, MD  Division of Gynecologic Oncology  Department of Obstetrics and Gynecology  [facility]  ___________________________________________  Chief Complaint: Chief  Complaint  Patient presents with   Vulvar cancer ([place])    History of Present Illness:  Christina  [name] is a 45 y.o. y.o. female who is seen in consultation at the request of Dr. 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  Per her report, she has been treated at multiple emergency departments for the symptoms.  More recently, she has seen her gynecologist multiple times in the last several months for recurrent symptoms.  She describes this as pain along the left posterior aspect of her vaginal opening, similar in location and severity to when she has had symptoms before.  Her most recent symptoms started 2 weeks ago.  She has had cysts/abscesses lanced before and been on multiple rounds of antibiotics.  She historically will have improvement with drainage and antibiotic therapy and then will have recurrence of the cyst 2 to 4 weeks later.  She notes having difficulty moving and finding comfortable position to sit in secondary to this pain.  She has been off of narcotics now for 2 days and is using Tylenol and ibuprofen with minimal relief.  Has had about 2 weeks of drainage from her abscess.  At her most recent visit on 11/2, patient reported significant itching and an area of increased growth around her clitoris.  This was biopsied with pathology revealing squamous cell carcinoma, at least superficially invasive.  P16 immunostaining is strongly positive.  With regard to her vulvar cancer, patient denies any symptoms related to her anterior vulva other than some mild burning after she urinates, mild pruritus that has been stable.  She endorses a good appetite without nausea or emesis.  She and her husband are working on weight loss and eating a Mediterranean diet.  She notes some constipation since being on narcotics.  Her medical history is notable for metformin, last  hemoglobin A1c was 7.1% in September.  She has SVT with intermittent symptoms (can go up to 4 months without symptoms and sometimes as frequent symptoms).  She has a history of hyperlipidemia but her Lipitor was recently discontinued because her lipids had normalized.  In terms of surgical history, she had a hysterectomy in 2013 with a bladder sling.  She also has had an appendectomy and notes having to have another surgery for postoperative abscess drainage.  She also describes having been seen in the emergency department within the past couple of years and being told that she has an appendiceal stump that may need to be removed at some point.  Patient and her husband lived in [place].  They are currently in the [place] for her husband's job.  She reports half to three fourths of a pack of cigarette use per day.  PAST MEDICAL HISTORY:  Past Medical History:  Diagnosis Date   Diabetes mellitus without complication (HCC)    Hyperlipidemia    SVT (supraventricular tachycardia) ([place])      PAST SURGICAL HISTORY:  Past Surgical History:  Procedure Laterality Date   ABDOMINAL HYSTERECTOMY     APPENDECTOMY     TONSILLECTOMY      OB/GYN HISTORY:  OB History  Gravida Para Term Preterm AB Living  6 5          SAB IAB Ectopic Multiple Live Births               #  Outcome Date GA Lbr Len/2nd Weight Sex Delivery Anes PTL Lv  6 Gravida           5 Para           4 Para           3 Para           2 Para           1 Para             No LMP recorded. Patient has had a hysterectomy.  Age at menarche: 41 Age at menopause: No menses after her hysterectomy at age 77.  She notes that this was for precancerous cells found after a Pap smear.  She does not remember having any sort of excisional procedure.  She thinks that she has had normal Pap smears since. Hx of HRT: Denies Hx of STDs: Denies Last pap: Patient thinks she had one this year, I do not see 1 in care everywhere or the records  that we received History of abnormal pap smears: Yes see above  SCREENING STUDIES:  Last mammogram: 2019  Last colonoscopy: Has never had  MEDICATIONS: Outpatient Encounter Medications as of 12/29/2020  Medication Sig   ibuprofen (ADVIL) 800 MG tablet Take 1 tablet (800 mg total) by mouth every 8 (eight) hours as needed for moderate pain. For AFTER surgery   metFORMIN (GLUCOPHAGE) 500 MG tablet Take 500 mg by mouth daily.   metoprolol succinate (TOPROL XL) 25 MG 24 hr tablet Take 1 tablet (25 mg total) by mouth daily.   metroNIDAZOLE (FLAGYL) 500 MG tablet Take 1 tablet (500 mg total) by mouth 2 (two) times daily for 7 days.   oxyCODONE (OXY IR/ROXICODONE) 5 MG immediate release tablet Take 1 tablet (5 mg total) by mouth every 4 (four) hours as needed for severe pain. Do not take and drive   senna-docusate (SENOKOT-S) 8.6-50 MG tablet Take 2 tablets by mouth at bedtime. Do not take if having diarrhea   sulfamethoxazole-trimethoprim (BACTRIM DS) 800-160 MG tablet Take 1 tablet by mouth 2 (two) times daily for 7 days.   [EXPIRED] HYDROcodone-acetaminophen (NORCO/VICODIN) 5-325 MG tablet Take by mouth.   [DISCONTINUED] acetaminophen-codeine (TYLENOL #3) 300-30 MG tablet Take 1 tablet by mouth every 6 (six) hours.   [DISCONTINUED] atorvastatin (LIPITOR) 40 MG tablet Take 1 tablet (40 mg total) by mouth daily.   No facility-administered encounter medications on file as of 12/29/2020.    ALLERGIES:  No Known Allergies   FAMILY HISTORY:  Family History  Problem Relation Age of Onset   Cancer Mother    Ovarian cancer Mother 28   Colon cancer Mother 17   Heart disease Mother    Heart disease Father    Heart attack Father    Breast cancer Maternal Aunt 41   Breast cancer Maternal Aunt 47     SOCIAL HISTORY:  Social Connections: Not on file    REVIEW OF SYSTEMS:  Pertinent positives include dysuria, pelvic pain, vaginal bleeding, vaginal discharge, difficulty walking. Denies  appetite changes, fevers, chills, fatigue, unexplained weight changes. Denies hearing loss, neck lumps or masses, mouth sores, ringing in ears or voice changes. Denies cough or wheezing.  Denies shortness of breath. Denies chest pain or palpitations. Denies leg swelling. Denies abdominal distention, pain, blood in stools, constipation, diarrhea, nausea, vomiting, or early satiety. Denies pain with intercourse, frequency, hematuria or incontinence. Denies hot flashes.   Denies joint pain, back pain or muscle pain/cramps. Denies  itching, rash, or wounds. Denies dizziness, headaches, numbness or seizures. Denies swollen lymph nodes or glands, denies easy bruising or bleeding. Denies anxiety, depression, confusion, or decreased concentration.  Physical Exam:  Vital Signs for this encounter:  Blood pressure (!) 141/100, pulse 98, temperature 98.2 F (36.8 C), temperature source Oral, resp. rate 18, weight 226 lb 5 oz (102.7 kg), SpO2 99 %. Body mass index is 33.42 kg/m. General: Alert, oriented, no acute distress.  HEENT: Normocephalic, atraumatic. Sclera anicteric.  Chest: Clear to auscultation bilaterally. No wheezes, rhonchi, or rales. Cardiovascular: Regular rate and rhythm, no murmurs, rubs, or gallops.  Abdomen: Obese. Normoactive bowel sounds. Soft, nondistended, nontender to palpation. No masses or hepatosplenomegaly appreciated. No palpable fluid wave.  Extremities: Grossly normal range of motion. Warm, well perfused. No edema bilaterally.  Skin: No rashes or lesions.  Lymphatics: No cervical, supraclavicular, or inguinal adenopathy.  GU: External female genitalia notable for a mass that is approximately 3 cm that appears to be emanating from an replaces the clitoris and that extends down bilateral labia minora.  There does not appear to be deep invasion.  This area is quite mobile with limited tenderness on palpation.  The lesion itself is multiple centimeters away from the urethra.   Along the posterior left vaginal introitus, there is a 2-3 cm area of fluctuance that is very painful to palpation with purulent drainage noted.  Patient did not tolerate either a speculum or internal exam.  No masses or fluctuance appreciated on rectal exam.  LABORATORY AND RADIOLOGIC DATA:  Outside medical records were reviewed to synthesize the above history, along with the history and physical obtained during the visit.   Lab Results  Component Value Date   WBC 12.1 (H) 04/04/2017   HGB 15.8 04/04/2017   HCT 45.6 04/04/2017   PLT 332 04/04/2017   GLUCOSE 150 (H) 11/27/2018   CHOL 192 11/27/2018   TRIG 145 11/27/2018   HDL 38 (L) 11/27/2018   LDLCALC 128 (H) 11/27/2018   ALT 25 04/04/2017   AST 18 04/04/2017   NA 137 11/27/2018   K 4.3 11/27/2018   CL 103 11/27/2018   CREATININE 0.75 11/27/2018   BUN 8 11/27/2018   CO2 19 (L) 11/27/2018   HGBA1C 6.7 (H) 11/27/2018

## 2020-12-30 ENCOUNTER — Encounter: Payer: Self-pay | Admitting: Gynecologic Oncology

## 2020-12-30 NOTE — Progress Notes (Signed)
Patient here with her husband for new patient consultation with Dr. Jeral Pinch for vulvar cancer and for a pre-operative appointment prior to her scheduled surgery on January 04, 2021. She is scheduled for wide local excision of the vulva and bartholin cyst excision vs marsupialization. The surgery was discussed in detail.  See after visit summary for additional details. Visual aids used to discuss items related to surgery including sequential compression stockings, multi-modal pain regimen including tylenol, female reproductive system to discuss surgery in detail.      Discussed post-op pain management in detail including the aspects of the enhanced recovery pathway.  Advised her that a new prescription would be sent in for oxycodone and it could be used for acute vulvar pain pre-operatively per Dr. Berline Lopes.  We discussed the use of tylenol as well and to monitor for a maximum of 4,000 mg in a 24 hour period.  Also prescribed sennakot to be used after surgery and to hold if having loose stools.  Discussed bowel regimen in detail.     Discussed the use of heparin pre-op, SCDs, and measures to take at home to prevent DVT including frequent mobility.  Reportable signs and symptoms of DVT discussed. Post-operative instructions discussed and expectations for after surgery. Incisional care discussed as well including reportable signs and symptoms including erythema, drainage, wound separation.   Discussed Dr. Charisse March recommendations. Sitz bath and peri bottles given and pt instructed on use.   "Please do 2-3 Sitzs baths a day for 10-20 minutes. Please take antibiotics prescribed today twice a day until surgery. Please start something, such as Miralax or Senakot, now for your bowel function.  You need to begin taking bactrim and flagyl antibiotics now to treat the vulvar abscess. You will need to avoid alcohol use while taking flagyl. We will obtain culture results today and will contact you if a change  in antibiotics is needed."    10 minutes spent with the patient.  Verbalizing understanding of material discussed. No needs or concerns voiced at the end of the visit.   Advised patient and family to call for any needs.  Advised that her medications had been prescribed and could be picked up at any time. Advised she will be contacted when the culture results return.    This appointment is included in the global surgical bundle as pre-operative teaching and has no charge.

## 2020-12-30 NOTE — Patient Instructions (Signed)
Please do 2-3 Sitzs baths a day for 10-20 minutes. Please take antibiotics prescribed today twice a day until surgery. Please start something, such as Miralax or Senakot, now for your bowel function.   Preparing for your Surgery   Plan for surgery on January 04, 2021 with Dr. Jeral Pinch at [facility] will be scheduled for a wide local excision of the vulva and bartholin cyst excision vs marsupialization.    You need to begin taking bactrim and flagyl antibiotics now to treat the vulvar abscess. You will need to avoid alcohol use while taking flagyl. We will obtain culture results today and will contact you if a change in antibiotics is needed.   Pre-operative Testing -You will receive a phone call from presurgical testing at [facility] to arrange for a pre-operative appointment and lab work.   -Bring your insurance card, copy of an advanced directive if applicable, medication list   -You should not be taking blood thinners or aspirin at least ten days prior to surgery unless instructed by your surgeon.   -Do not take supplements such as fish oil (omega 3), red yeast rice, turmeric before your surgery. You want to avoid medications with aspirin in them including headache powders such as BC or Goody's), Excedrin migraine.   Day Before Surgery at [place] will be advised you can have clear liquids up until 3 hours before your surgery.     Your role in recovery Your role is to become active as soon as directed by your doctor, while still giving yourself time to heal.  Rest when you feel tired. You will be asked to do the following in order to speed your recovery:   - Cough and breathe deeply. This helps to clear and expand your lungs and can prevent pneumonia after surgery.  - Christina [name]. Do mild physical activity. Walking or moving your legs help your circulation and body functions return to normal. Do not try to get up or walk alone the  first time after surgery.   -If you develop swelling on one leg or the other, pain in the back of your leg, redness/warmth in one of your legs, please call the office or go to the Emergency Room to have a doppler to rule out a blood clot. For shortness of breath, chest pain-seek care in the Emergency Room as soon as possible. - Actively manage your pain. Managing your pain lets you move in comfort. We will ask you to rate your pain on a scale of zero to 10. It is your responsibility to tell your doctor or nurse where and how much you hurt so your pain can be treated.   Special Considerations -If you are diabetic, you may be placed on insulin after surgery to have closer control over your blood sugars to promote healing and recovery.  This does not mean that you will be discharged on insulin.  If applicable, your oral antidiabetics will be resumed when you are tolerating a solid diet.   -Your final pathology results from surgery should be available around one week after surgery and the results will be relayed to you when available.   -FMLA forms can be faxed to [phone] and please allow 5-7 business days for completion.   Pain Management After Surgery -You have been prescribed your pain medication and bowel regimen medications before surgery so that you can have these available when you are discharged from the hospital.    -  Make sure that you have Tylenol and Ibuprofen at home to use on a regular basis after surgery for pain control. We recommend alternating the medications every hour to six hours since they work differently and are processed in the body differently for pain relief.   -Review the attached handout on narcotic use and their risks and side effects.    Bowel Regimen -You have been prescribed Sennakot-S to take nightly to prevent constipation especially if you are taking the narcotic pain medication intermittently.  It is important to prevent constipation and drink adequate amounts  of liquids. You can stop taking this medication when you are not taking pain medication and you are back on your normal bowel routine.   Risks of Surgery Risks of surgery are low but include bleeding, infection, damage to surrounding structures, re-operation, blood clots, and very rarely death.   AFTER SURGERY INSTRUCTIONS   Return to work: 4-6 weeks if applicable   Activity: 1. Be up and out of the bed during the day.  Take a nap if needed.  You may walk up steps but be careful and use the hand rail.  Stair climbing will tire you more than you think, you may need to stop part way and rest.    2. No lifting or straining for 6 weeks over 10 pounds. No pushing, pulling, straining for 6 weeks.   3. Do not drive if you are taking narcotic pain medicine and make sure that your reaction time has returned.    4. You can shower as soon as the next day after surgery. Shower daily.  Use your regular soap and water (not directly on the incision) and pat your incision(s) dry afterwards; don't rub.     5. No sexual activity and nothing in the vagina for 8 weeks.   6. You may experience a small amount of clear drainage from your incision, which is normal.  If the drainage persists, increases, or changes color please call the office.   7. You may experience vaginal spotting after surgery or around the 6-8 week mark from surgery when the stitches at the top of the vagina begin to dissolve.  The spotting is normal but if you experience heavy bleeding, call our office.   8. Take Tylenol or ibuprofen first for pain and only use narcotic pain medication for severe pain not relieved by the Tylenol or Ibuprofen.  Monitor your Tylenol intake to a max of 4,000 mg in a 24 hour period. You can alternate these medications after surgery.   Diet: 1. Low sodium Heart Healthy Diet is recommended but you are cleared to resume your normal (before surgery) diet after your procedure.   2. It is safe to use a laxative,  such as Miralax or Colace, if you have difficulty moving your bowels. You have been prescribed Sennakot-S to take at bedtime every evening after surgery to keep bowel movements regular and to prevent constipation.  YOU CAN BEGIN THIS NOW AS WELL.   Wound Care: 1. Keep clean and dry.  Shower daily.   Reasons to call the Doctor: Fever - Oral temperature greater than 100.4 degrees Fahrenheit Foul-smelling vaginal discharge Difficulty urinating Nausea and vomiting Increased pain at the site of the incision that is unrelieved with pain medicine. Difficulty breathing with or without chest pain New calf pain especially if only on one side Sudden, continuing increased vaginal bleeding with or without clots.   Contacts: For questions or concerns you should contact:   Dr.  Jeral Pinch at [phone]   [name], NP at [phone]   After Hours: call [phone] and have the GYN Oncologist paged/contacted (after 5 pm or on the weekends).   Messages sent via mychart are for non-urgent matters and are not responded to after hours so for urgent needs, please call the after hours number.

## 2020-12-30 NOTE — Patient Instructions (Addendum)
DUE TO COVID-19 ONLY ONE VISITOR IS ALLOWED TO COME WITH YOU AND STAY IN THE WAITING ROOM ONLY DURING PRE OP AND PROCEDURE.   **NO VISITORS ARE ALLOWED IN THE SHORT STAY AREA OR RECOVERY ROOM!!**        Your procedure is scheduled on:  Tuesday, 01-04-21   Report to [facility] Main  Entrance     Report to admitting at 5:15 AM   Call this number if you have problems the morning of surgery [phone]   Do not eat food :After Midnight.   May have liquids until 4:30 AM day of surgery  CLEAR LIQUID DIET  Foods Allowed                                                                     Foods Excluded  Water, Black Coffee (no milk/no creamer) and tea, regular and decaf                              liquids that you cannot  Plain Jell-O in any flavor  (No red)                         see through such as: Fruit ices (not with fruit pulp)                                 milk, soups, orange juice  Iced Popsicles (No red)                                    All solid food                             Apple juices Sports drinks like Gatorade (No red) Lightly seasoned clear broth or consume(fat free) Sugar   Complete one G2 drink the morning of surgery at  4:30 AM the day of surgery.       The day of surgery:  Drink ONE (1) Pre-Surgery G2  the morning of surgery. Drink in one sitting. Do not sip.  This drink was given to you during your hospital  pre-op appointment visit. Nothing else to drink after completing the Pre-Surgery G2.          If you have questions, please contact your surgeon's office.   Oral Hygiene is also important to reduce your risk of infection.                                    Remember - BRUSH YOUR TEETH THE MORNING OF SURGERY WITH YOUR REGULAR TOOTHPASTE   Do NOT smoke after Midnight   Take these medicines the morning of surgery with A SIP OF WATER: Metoprolol, Flagyl, Bactrim.   WHAT DO I DO ABOUT MY DIABETES MEDICATION?  Do not take oral  diabetes medicines (pills) the morning of surgery.  THE DAY BEFORE SURGERY: Take Metformin as prescribed.  THE MORNING OF SURGERY:  Do not take Metformin.  Reviewed and Endorsed by [facility] Patient Education Committee, August 2015    Stop all vitamins and herbal supplements a week before surgery             You may not have any metal on your body including hair pins, jewelry, and body piercing             Do not wear make-up, lotions, powders, perfumes or deodorant  Do not wear nail polish including gel and S&S, artificial/acrylic nails, or any other type of covering on natural nails including finger and toenails. If you have artificial nails, gel coating, etc. that needs to be removed by a nail salon please have this removed prior to surgery or surgery may need to be canceled/ delayed if the surgeon/ anesthesia feels like they are unable to be safely monitored.   Do not shave  48 hours prior to surgery.       Do not bring valuables to the hospital. [place].   Contacts, dentures or bridgework may not be worn into surgery.   Patients discharged the day of surgery will not be allowed to drive home.  Please read over the following fact sheets you were given: IF YOU HAVE QUESTIONS ABOUT YOUR PRE OP INSTRUCTIONS PLEASE CALL [facility] - Preparing for Surgery Before surgery, you can play an important role.  Because skin is not sterile, your skin needs to be as free of germs as possible.  You can reduce the number of germs on your skin by washing with CHG (chlorahexidine gluconate) soap before surgery.  CHG is an antiseptic cleaner which kills germs and bonds with the skin to continue killing germs even after washing. Please DO NOT use if you have an allergy to CHG or antibacterial soaps.  If your skin becomes reddened/irritated stop using the CHG and inform your nurse when you arrive at Short Stay. Do not shave (including legs  and underarms) for at least 48 hours prior to the first CHG shower.  You may shave your face/neck.  Please follow these instructions carefully:  1.  Shower with CHG Soap the night before surgery and the  morning of surgery.  2.  If you choose to wash your hair, wash your hair first as usual with your normal  shampoo.  3.  After you shampoo, rinse your hair and body thoroughly to remove the shampoo.                             4.  Use CHG as you would any other liquid soap.  You can apply chg directly to the skin and wash.  Gently with a scrungie or clean washcloth.  5.  Apply the CHG Soap to your body ONLY FROM THE NECK DOWN.   Do   not use on face/ open                           Wound or open sores. Avoid contact with eyes, ears mouth and   genitals (private parts).                       Wash face,  Genitals (private parts) with your normal soap.             6.  Wash thoroughly, paying special attention to the area where your    surgery  will be performed.  7.  Thoroughly rinse your body with warm water from the neck down.  8.  DO NOT shower/wash with your normal soap after using and rinsing off the CHG Soap.                9.  Pat yourself dry with a clean towel.            10.  Wear clean pajamas.            11.  Place clean sheets on your bed the night of your first shower and do not  sleep with pets. Day of Surgery : Do not apply any lotions/deodorants the morning of surgery.  Please wear clean clothes to the hospital/surgery center.  FAILURE TO FOLLOW THESE INSTRUCTIONS MAY RESULT IN THE CANCELLATION OF YOUR SURGERY  PATIENT SIGNATURE_________________________________  NURSE SIGNATURE__________________________________  ________________________________________________________________________

## 2020-12-30 NOTE — Progress Notes (Addendum)
COVID swab appointment:  N/A  COVID Vaccine Completed:  No Date COVID Vaccine completed: Has received booster: COVID vaccine manufacturer: [place]   Date of COVID positive in last 90 days:  No  PCP - No PCP Cardiologist -  Kardie Tobb, DO.  Last OV 2021  Chest x-ray - N/A EKG - 12-31-20 Epic Stress Test - greater than 2 years ECHO - greater than 2 years Epic Cardiac Cath - N/A Pacemaker/ICD device last checked: Spinal Cord Stimulator: Long Term Monitor - 2020 Epic  Sleep Study - N/A CPAP -   Fasting Blood Sugar -  Checks Blood Sugar - does not check   Blood Thinner Instructions:  N/A Aspirin Instructions: Last Dose:  Activity level:   Can go up a flight of stairs and perform activities of daily living without stopping and without symptoms of chest pain or shortness of breath.    Anesthesia review:  SVT, Mobitz type II, DM  Patient states that she does have shortness of breath with SVT episodes.   On Metoprolol and states that SVT episodes are well-controlled.  Patient denies shortness of breath, fever, cough and chest pain at PAT appointment  Patient verbalized understanding of instructions that were given to them at the PAT appointment. Patient was also instructed that they will need to review over the PAT instructions again at home before surgery.

## 2020-12-31 ENCOUNTER — Other Ambulatory Visit: Payer: Self-pay

## 2020-12-31 ENCOUNTER — Encounter (HOSPITAL_COMMUNITY)
Admission: RE | Admit: 2020-12-31 | Discharge: 2020-12-31 | Disposition: A | Discharge status: Home / Self Care | Payer: BC Managed Care – PPO | Source: Ambulatory Visit | Attending: Gynecologic Oncology | Admitting: Gynecologic Oncology

## 2020-12-31 ENCOUNTER — Encounter (HOSPITAL_COMMUNITY): Payer: Self-pay

## 2020-12-31 VITALS — BP 128/79 | HR 91 | Temp 98.1°F | Resp 16 | Ht 69.0 in | Wt 225.4 lb

## 2020-12-31 DIAGNOSIS — C519 Malignant neoplasm of vulva, unspecified: Secondary | ICD-10-CM | POA: Insufficient documentation

## 2020-12-31 DIAGNOSIS — E119 Type 2 diabetes mellitus without complications: Secondary | ICD-10-CM | POA: Diagnosis not present

## 2020-12-31 DIAGNOSIS — N751 Abscess of Bartholin's gland: Secondary | ICD-10-CM | POA: Insufficient documentation

## 2020-12-31 DIAGNOSIS — Z01818 Encounter for other preprocedural examination: Secondary | ICD-10-CM | POA: Insufficient documentation

## 2020-12-31 HISTORY — DX: Malignant neoplasm of vulva, unspecified: C51.9

## 2020-12-31 HISTORY — DX: Abscess of Bartholin's gland: N75.1

## 2020-12-31 HISTORY — DX: Other complications of anesthesia, initial encounter: T88.59XA

## 2020-12-31 HISTORY — DX: Gastro-esophageal reflux disease without esophagitis: K21.9

## 2020-12-31 LAB — CBC
HCT: 47.6 % — ABNORMAL HIGH (ref 36.0–46.0)
Hemoglobin: 15.9 g/dL — ABNORMAL HIGH (ref 12.0–15.0)
MCH: 31.5 pg (ref 26.0–34.0)
MCHC: 33.4 g/dL (ref 30.0–36.0)
MCV: 94.4 fL (ref 80.0–100.0)
Platelets: 393 10*3/uL (ref 150–400)
RBC: 5.04 MIL/uL (ref 3.87–5.11)
RDW: 12.9 % (ref 11.5–15.5)
WBC: 12.6 10*3/uL — ABNORMAL HIGH (ref 4.0–10.5)
nRBC: 0 % (ref 0.0–0.2)

## 2020-12-31 LAB — COMPREHENSIVE METABOLIC PANEL
ALT: 20 U/L (ref 0–44)
AST: 16 U/L (ref 15–41)
Albumin: 4 g/dL (ref 3.5–5.0)
Alkaline Phosphatase: 98 U/L (ref 38–126)
Anion gap: 10 (ref 5–15)
BUN: 11 mg/dL (ref 6–20)
CO2: 22 mmol/L (ref 22–32)
Calcium: 9.7 mg/dL (ref 8.9–10.3)
Chloride: 102 mmol/L (ref 98–111)
Creatinine, Ser: 0.83 mg/dL (ref 0.44–1.00)
GFR, Estimated: >60 mL/min (ref >60)
Glucose, Bld: 173 mg/dL — ABNORMAL HIGH (ref 70–99)
Potassium: 4.2 mmol/L (ref 3.5–5.1)
Sodium: 134 mmol/L — ABNORMAL LOW (ref 135–145)
Total Bilirubin: 0.2 mg/dL — ABNORMAL LOW (ref 0.3–1.2)
Total Protein: 8 g/dL (ref 6.5–8.1)

## 2020-12-31 LAB — GLUCOSE, CAPILLARY: Glucose-Capillary: 199 mg/dL — ABNORMAL HIGH (ref 70–99)

## 2020-12-31 LAB — HEMOGLOBIN A1C
Hgb A1c MFr Bld: 6.8 % — ABNORMAL HIGH (ref 4.8–5.6)
Mean Plasma Glucose: 148.46 mg/dL

## 2021-01-03 ENCOUNTER — Other Ambulatory Visit: Payer: Self-pay | Admitting: Gynecologic Oncology

## 2021-01-03 ENCOUNTER — Telehealth: Payer: Self-pay

## 2021-01-03 DIAGNOSIS — N751 Abscess of Bartholin's gland: Secondary | ICD-10-CM

## 2021-01-03 LAB — AEROBIC/ANAEROBIC CULTURE W GRAM STAIN (SURGICAL/DEEP WOUND): Gram Stain: NONE SEEN

## 2021-01-03 MED ORDER — CEFDINIR 300 MG PO CAPS: 300.0000 mg | ORAL_CAPSULE | Freq: Two times a day (BID) | ORAL | 0 refills | Status: DC

## 2021-01-03 NOTE — Anesthesia Preprocedure Evaluation (Addendum)
Anesthesia Evaluation  Patient identified by MRN, date of birth, ID band Patient awake    Reviewed: Allergy & Precautions, NPO status , Patient's Chart, lab work & pertinent test results  Airway Mallampati: II  TM Distance: >3 FB Neck ROM: Full    Dental no notable dental hx.    Pulmonary Current Smoker and Patient abstained from smoking.,    Pulmonary exam normal        Cardiovascular Normal cardiovascular exam+ dysrhythmias Supra Ventricular Tachycardia      Neuro/Psych negative neurological ROS  negative psych ROS   GI/Hepatic   Endo/Other  diabetes, Well Controlled, Type 2, Oral Hypoglycemic Agents  Renal/GU      Musculoskeletal   Abdominal (+) + obese,   Peds  Hematology negative hematology ROS (+)   Anesthesia Other Findings   Reproductive/Obstetrics                            Anesthesia Physical Anesthesia Plan  ASA: 2  Anesthesia Plan: General   Post-op Pain Management:    Induction: Intravenous  PONV Risk Score and Plan: 4 or greater and Ondansetron, Midazolam and Treatment may vary due to age or medical condition  Airway Management Planned: Oral ETT  Additional Equipment: None  Intra-op Plan:   Post-operative Plan: Extubation in OR  Informed Consent: I have reviewed the patients History and Physical, chart, labs and discussed the procedure including the risks, benefits and alternatives for the proposed anesthesia with the patient or authorized representative who has indicated his/her understanding and acceptance.     Dental advisory given  Plan Discussed with: CRNA  Anesthesia Plan Comments:        Anesthesia Quick Evaluation

## 2021-01-03 NOTE — Progress Notes (Signed)
Cefdinir sent in per Dr. Berline Lopes based on culture results.

## 2021-01-03 NOTE — Telephone Encounter (Signed)
Telephone call to check on pre-operative status. Patient compliant with pre-operative instructions.  Reinforced nothing to eat after midnight. Clear liquids until 0430.  Reviewed results of culture on 12/29/20. Per Dr. Berline Lopes stop taking Bactrim and Flagyl. A new prescription has been sent in for cefdinir. Patient verbalized understanding.  No questions or concerns voiced. Instructed to call for any needs.

## 2021-01-04 ENCOUNTER — Telehealth: Payer: Self-pay

## 2021-01-04 ENCOUNTER — Ambulatory Visit (HOSPITAL_COMMUNITY): Payer: BC Managed Care – PPO | Admitting: Physician Assistant

## 2021-01-04 ENCOUNTER — Inpatient Hospital Stay (HOSPITAL_COMMUNITY): Admission: RE | Admit: 2021-01-04 | Payer: BC Managed Care – PPO | Source: Ambulatory Visit

## 2021-01-04 ENCOUNTER — Ambulatory Visit (HOSPITAL_COMMUNITY)
Admission: RE | Admit: 2021-01-04 | Discharge: 2021-01-04 | Disposition: A | Discharge status: Home / Self Care | Payer: BC Managed Care – PPO | Attending: Gynecologic Oncology | Admitting: Gynecologic Oncology

## 2021-01-04 ENCOUNTER — Encounter (HOSPITAL_COMMUNITY): Payer: Self-pay | Admitting: Gynecologic Oncology

## 2021-01-04 ENCOUNTER — Encounter (HOSPITAL_COMMUNITY)
Admission: RE | Disposition: A | Discharge status: Home / Self Care | Payer: Self-pay | Source: Home / Self Care | Attending: Gynecologic Oncology

## 2021-01-04 ENCOUNTER — Ambulatory Visit (HOSPITAL_COMMUNITY): Payer: BC Managed Care – PPO | Admitting: Anesthesiology

## 2021-01-04 DIAGNOSIS — N751 Abscess of Bartholin's gland: Secondary | ICD-10-CM | POA: Insufficient documentation

## 2021-01-04 DIAGNOSIS — C512 Malignant neoplasm of clitoris: Secondary | ICD-10-CM | POA: Diagnosis not present

## 2021-01-04 DIAGNOSIS — Z7984 Long term (current) use of oral hypoglycemic drugs: Secondary | ICD-10-CM | POA: Diagnosis not present

## 2021-01-04 DIAGNOSIS — F17210 Nicotine dependence, cigarettes, uncomplicated: Secondary | ICD-10-CM | POA: Insufficient documentation

## 2021-01-04 DIAGNOSIS — I471 Supraventricular tachycardia: Secondary | ICD-10-CM | POA: Diagnosis not present

## 2021-01-04 DIAGNOSIS — C519 Malignant neoplasm of vulva, unspecified: Secondary | ICD-10-CM | POA: Insufficient documentation

## 2021-01-04 DIAGNOSIS — E785 Hyperlipidemia, unspecified: Secondary | ICD-10-CM | POA: Insufficient documentation

## 2021-01-04 DIAGNOSIS — E119 Type 2 diabetes mellitus without complications: Secondary | ICD-10-CM | POA: Insufficient documentation

## 2021-01-04 HISTORY — PX: VULVECTOMY: SHX1086

## 2021-01-04 HISTORY — PX: BARTHOLIN CYST MARSUPIALIZATION: SHX5383

## 2021-01-04 LAB — GLUCOSE, CAPILLARY
Glucose-Capillary: 191 mg/dL — ABNORMAL HIGH (ref 70–99)
Glucose-Capillary: 182 mg/dL — ABNORMAL HIGH (ref 70–99)

## 2021-01-04 SURGERY — WIDE EXCISION VULVECTOMY
Anesthesia: General

## 2021-01-04 MED ORDER — OXYCODONE HCL 5 MG PO TABS
ORAL_TABLET | ORAL | Status: AC
  Administered 2021-01-04: 5 mg via ORAL
  Filled 2021-01-04: qty 1

## 2021-01-04 MED ORDER — ACETAMINOPHEN 325 MG PO TABS: 325.0000 mg | ORAL_TABLET | ORAL | Status: DC | PRN

## 2021-01-04 MED ORDER — MIDAZOLAM HCL 2 MG/2ML IJ SOLN
INTRAMUSCULAR | Status: DC | PRN
  Administered 2021-01-04: 2 mg via INTRAVENOUS

## 2021-01-04 MED ORDER — PROPOFOL 10 MG/ML IV BOLUS
INTRAVENOUS | Status: AC
  Filled 2021-01-04: qty 40

## 2021-01-04 MED ORDER — FENTANYL CITRATE PF 50 MCG/ML IJ SOSY
PREFILLED_SYRINGE | INTRAMUSCULAR | Status: AC
  Filled 2021-01-04: qty 1

## 2021-01-04 MED ORDER — SCOPOLAMINE 1 MG/3DAYS TD PT72: 1.0000 | MEDICATED_PATCH | TRANSDERMAL | Status: DC

## 2021-01-04 MED ORDER — 0.9 % SODIUM CHLORIDE (POUR BTL) OPTIME
TOPICAL | Status: DC | PRN
  Administered 2021-01-04: 1000 mL

## 2021-01-04 MED ORDER — CELECOXIB 200 MG PO CAPS: 200.0000 mg | ORAL_CAPSULE | ORAL | Status: AC

## 2021-01-04 MED ORDER — FENTANYL CITRATE PF 50 MCG/ML IJ SOSY
25.0000 ug | PREFILLED_SYRINGE | INTRAMUSCULAR | Status: DC | PRN
  Administered 2021-01-04 (×3): 50 ug via INTRAVENOUS

## 2021-01-04 MED ORDER — DEXAMETHASONE SODIUM PHOSPHATE 10 MG/ML IJ SOLN
INTRAMUSCULAR | Status: DC | PRN
  Administered 2021-01-04: 8 mg via INTRAVENOUS

## 2021-01-04 MED ORDER — HEPARIN SODIUM (PORCINE) 5000 UNIT/ML IJ SOLN
INTRAMUSCULAR | Status: AC
  Administered 2021-01-04: 5000 [IU] via SUBCUTANEOUS
  Filled 2021-01-04: qty 1

## 2021-01-04 MED ORDER — KETOROLAC TROMETHAMINE 30 MG/ML IJ SOLN: 30.0000 mg | Freq: Once | INTRAMUSCULAR | Status: DC | PRN

## 2021-01-04 MED ORDER — CEFAZOLIN SODIUM-DEXTROSE 2-4 GM/100ML-% IV SOLN
2.0000 g | INTRAVENOUS | Status: AC
  Administered 2021-01-04: 2 g via INTRAVENOUS

## 2021-01-04 MED ORDER — ACETAMINOPHEN 500 MG PO TABS
ORAL_TABLET | ORAL | Status: AC
  Administered 2021-01-04: 1000 mg via ORAL
  Filled 2021-01-04: qty 2

## 2021-01-04 MED ORDER — CELECOXIB 200 MG PO CAPS
ORAL_CAPSULE | ORAL | Status: AC
  Administered 2021-01-04: 200 mg via ORAL
  Filled 2021-01-04: qty 1

## 2021-01-04 MED ORDER — FENTANYL CITRATE (PF) 250 MCG/5ML IJ SOLN
INTRAMUSCULAR | Status: AC
  Filled 2021-01-04: qty 5

## 2021-01-04 MED ORDER — LIDOCAINE HCL (CARDIAC) PF 100 MG/5ML IV SOSY
PREFILLED_SYRINGE | INTRAVENOUS | Status: DC | PRN
  Administered 2021-01-04: 100 mg via INTRAVENOUS

## 2021-01-04 MED ORDER — MEPERIDINE HCL 50 MG/ML IJ SOLN: 6.2500 mg | INTRAMUSCULAR | Status: DC | PRN

## 2021-01-04 MED ORDER — HEPARIN SODIUM (PORCINE) 5000 UNIT/ML IJ SOLN: 5000.0000 [IU] | INTRAMUSCULAR | Status: AC

## 2021-01-04 MED ORDER — LIDOCAINE HCL (PF) 1 % IJ SOLN
INTRAMUSCULAR | Status: AC
  Filled 2021-01-04: qty 30

## 2021-01-04 MED ORDER — LACTATED RINGERS IV SOLN: INTRAVENOUS | Status: DC

## 2021-01-04 MED ORDER — ONDANSETRON HCL 4 MG/2ML IJ SOLN: 4.0000 mg | Freq: Once | INTRAMUSCULAR | Status: DC | PRN

## 2021-01-04 MED ORDER — SODIUM CHLORIDE 0.9 % IV SOLN
2.0000 g | Freq: Once | INTRAVENOUS | Status: AC
  Administered 2021-01-04: 2 g via INTRAVENOUS
  Filled 2021-01-04: qty 2

## 2021-01-04 MED ORDER — SUGAMMADEX SODIUM 200 MG/2ML IV SOLN
INTRAVENOUS | Status: DC | PRN
  Administered 2021-01-04: 200 mg via INTRAVENOUS

## 2021-01-04 MED ORDER — GABAPENTIN 300 MG PO CAPS
ORAL_CAPSULE | ORAL | Status: AC
  Administered 2021-01-04: 300 mg via ORAL
  Filled 2021-01-04: qty 1

## 2021-01-04 MED ORDER — OXYCODONE HCL 5 MG PO TABS: 5.0000 mg | ORAL_TABLET | Freq: Once | ORAL | Status: AC | PRN

## 2021-01-04 MED ORDER — LIDOCAINE HCL (PF) 2 % IJ SOLN
INTRAMUSCULAR | Status: AC
  Filled 2021-01-04: qty 5

## 2021-01-04 MED ORDER — ORAL CARE MOUTH RINSE: 15.0000 mL | Freq: Once | OROMUCOSAL | Status: AC

## 2021-01-04 MED ORDER — PROPOFOL 10 MG/ML IV BOLUS
INTRAVENOUS | Status: DC | PRN
  Administered 2021-01-04: 170 mg via INTRAVENOUS

## 2021-01-04 MED ORDER — ACETAMINOPHEN 160 MG/5ML PO SOLN: 325.0000 mg | ORAL | Status: DC | PRN

## 2021-01-04 MED ORDER — DEXAMETHASONE SODIUM PHOSPHATE 4 MG/ML IJ SOLN: 4.0000 mg | INTRAMUSCULAR | Status: DC

## 2021-01-04 MED ORDER — CHLORHEXIDINE GLUCONATE 0.12 % MT SOLN
15.0000 mL | Freq: Once | OROMUCOSAL | Status: AC
  Administered 2021-01-04: 15 mL via OROMUCOSAL

## 2021-01-04 MED ORDER — FENTANYL CITRATE PF 50 MCG/ML IJ SOSY
PREFILLED_SYRINGE | INTRAMUSCULAR | Status: AC
  Filled 2021-01-04: qty 2

## 2021-01-04 MED ORDER — ACETAMINOPHEN 500 MG PO TABS: 1000.0000 mg | ORAL_TABLET | ORAL | Status: AC

## 2021-01-04 MED ORDER — FENTANYL CITRATE (PF) 250 MCG/5ML IJ SOLN
INTRAMUSCULAR | Status: DC | PRN
  Administered 2021-01-04: 50 ug via INTRAVENOUS
  Administered 2021-01-04 (×2): 100 ug via INTRAVENOUS
  Administered 2021-01-04: 50 ug via INTRAVENOUS
  Administered 2021-01-04: 100 ug via INTRAVENOUS
  Administered 2021-01-04 (×2): 50 ug via INTRAVENOUS

## 2021-01-04 MED ORDER — ROCURONIUM BROMIDE 10 MG/ML (PF) SYRINGE
PREFILLED_SYRINGE | INTRAVENOUS | Status: AC
  Filled 2021-01-04: qty 10

## 2021-01-04 MED ORDER — ACETIC ACID 5 % SOLN
Status: AC
  Filled 2021-01-04: qty 50

## 2021-01-04 MED ORDER — GABAPENTIN 300 MG PO CAPS: 300.0000 mg | ORAL_CAPSULE | ORAL | Status: AC

## 2021-01-04 MED ORDER — SCOPOLAMINE 1 MG/3DAYS TD PT72
MEDICATED_PATCH | TRANSDERMAL | Status: AC
  Administered 2021-01-04: 1.5 mg via TRANSDERMAL
  Filled 2021-01-04: qty 1

## 2021-01-04 MED ORDER — CEFAZOLIN SODIUM-DEXTROSE 2-4 GM/100ML-% IV SOLN
INTRAVENOUS | Status: AC
  Filled 2021-01-04: qty 100

## 2021-01-04 MED ORDER — MIDAZOLAM HCL 2 MG/2ML IJ SOLN
INTRAMUSCULAR | Status: AC
  Filled 2021-01-04: qty 2

## 2021-01-04 MED ORDER — LIDOCAINE HCL 1 % IJ SOLN
INTRAMUSCULAR | Status: DC | PRN
  Administered 2021-01-04: 10 mL
  Administered 2021-01-04: 39 mL

## 2021-01-04 MED ORDER — ONDANSETRON HCL 4 MG/2ML IJ SOLN
INTRAMUSCULAR | Status: DC | PRN
  Administered 2021-01-04: 4 mg via INTRAVENOUS

## 2021-01-04 MED ORDER — OXYCODONE HCL 5 MG/5ML PO SOLN: 5.0000 mg | Freq: Once | ORAL | Status: AC | PRN

## 2021-01-04 MED ORDER — ROCURONIUM BROMIDE 10 MG/ML (PF) SYRINGE
PREFILLED_SYRINGE | INTRAVENOUS | Status: DC | PRN
  Administered 2021-01-04: 80 mg via INTRAVENOUS

## 2021-01-04 SURGICAL SUPPLY — 52 items
APL SWBSTK 6 STRL LF DISP (MISCELLANEOUS) ×4
APPLICATOR COTTON TIP 6 STRL (MISCELLANEOUS) ×4 IMPLANT
APPLICATOR COTTON TIP 6IN STRL (MISCELLANEOUS) ×8
BAG COUNTER SPONGE SURGICOUNT (BAG) IMPLANT
BAG SPNG CNTER NS LX DISP (BAG)
BLADE SURG 15 STRL LF DISP TIS (BLADE) ×1 IMPLANT
BLADE SURG 15 STRL SS (BLADE) ×2
CATH ROBINSON RED A/P 14FR (CATHETERS) ×2 IMPLANT
CATH ROBINSON RED A/P 16FR (CATHETERS) ×2 IMPLANT
DRAPE HYSTEROSCOPY (MISCELLANEOUS) ×2 IMPLANT
DRAPE SURG IRRIG POUCH 19X23 (DRAPES) ×2 IMPLANT
DRSG TELFA 3X8 NADH (GAUZE/BANDAGES/DRESSINGS) ×2 IMPLANT
ELECT PENCIL ROCKER SW 15FT (MISCELLANEOUS) ×2 IMPLANT
GAUZE 4X4 16PLY ~~LOC~~+RFID DBL (SPONGE) ×2 IMPLANT
GAUZE PACKING IODOFORM 1/2 (PACKING) ×6 IMPLANT
GAUZE SPONGE 4X4 12PLY STRL (GAUZE/BANDAGES/DRESSINGS) ×2 IMPLANT
GLOVE SURG ENC MOIS LTX SZ6 (GLOVE) ×4 IMPLANT
GLOVE SURG NEOPR MICRO LF SZ8 (GLOVE) ×2 IMPLANT
GLOVE SURG POLY ORTHO LF SZ7.5 (GLOVE) ×2 IMPLANT
GLOVE SURG POLYISO LF SZ6.5 (GLOVE) ×2 IMPLANT
GLOVE SURG POLYISO LF SZ8 (GLOVE) ×2 IMPLANT
GLOVE SURG UNDER POLY LF SZ6.5 (GLOVE) ×2 IMPLANT
GOWN STRL REUS W/ TWL LRG LVL3 (GOWN DISPOSABLE) ×1 IMPLANT
GOWN STRL REUS W/TWL LRG LVL3 (GOWN DISPOSABLE) ×6 IMPLANT
KIT BASIN OR (CUSTOM PROCEDURE TRAY) ×2 IMPLANT
NEEDLE HYPO 22GX1.5 SAFETY (NEEDLE) ×4 IMPLANT
NEEDLE SPNL 22GX7 QUINCKE BK (NEEDLE) IMPLANT
NS IRRIG 1000ML POUR BTL (IV SOLUTION) ×2 IMPLANT
PACK LITHOTOMY IV (CUSTOM PROCEDURE TRAY) ×2 IMPLANT
PENCIL SMOKE EVACUATOR (MISCELLANEOUS) IMPLANT
REMOVER STAPLE SKIN (DISPOSABLE) ×2 IMPLANT
SCOPETTES 8  STERILE (MISCELLANEOUS) ×1
SCOPETTES 8 STERILE (MISCELLANEOUS) ×1 IMPLANT
SHEET LAVH (DRAPES) ×2 IMPLANT
SOL PREP PROV IODINE SCRUB 4OZ (MISCELLANEOUS) ×2 IMPLANT
SUT VIC AB 0 CT1 27 (SUTURE) ×2
SUT VIC AB 0 CT1 27XBRD ANTBC (SUTURE) ×1 IMPLANT
SUT VIC AB 2-0 SH 27 (SUTURE) ×8
SUT VIC AB 2-0 SH 27X BRD (SUTURE) ×4 IMPLANT
SUT VIC AB 3-0 SH 27 (SUTURE) ×4
SUT VIC AB 3-0 SH 27XBRD (SUTURE) ×2 IMPLANT
SUT VIC AB 4-0 PS2 27 (SUTURE) ×4 IMPLANT
SYR BULB IRRIG 60ML STRL (SYRINGE) ×2 IMPLANT
SYR CONTROL 10ML LL (SYRINGE) ×4 IMPLANT
TOWEL OR 17X26 10 PK STRL BLUE (TOWEL DISPOSABLE) ×2 IMPLANT
TOWEL OR NON WOVEN STRL DISP B (DISPOSABLE) ×2 IMPLANT
TRAY FOLEY MTR SLVR 16FR STAT (SET/KITS/TRAYS/PACK) ×2 IMPLANT
UNDERPAD 30X36 HEAVY ABSORB (UNDERPADS AND DIAPERS) ×2 IMPLANT
WATER STERILE IRR 1000ML POUR (IV SOLUTION) IMPLANT
WATER STERILE IRR 500ML POUR (IV SOLUTION) ×2 IMPLANT
YANKAUER SUCT BULB TIP 10FT TU (MISCELLANEOUS) ×2 IMPLANT
YANKAUER SUCT BULB TIP NO VENT (SUCTIONS) ×2 IMPLANT

## 2021-01-04 NOTE — Discharge Instructions (Addendum)
Remove, replace packing daily.  Irrigate daily.   Contacts: For questions or concerns you should contact:  Dr. Jeral Pinch at [phone] After hours and on week-ends call [phone] and ask to speak to the physician on call for Gynecologic Oncology

## 2021-01-04 NOTE — Op Note (Signed)
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 After incision of the abscess, exploration with a digit noted tracking somewhat posteriorly and superiorly along left lateral vaginal sidewall. Significant induration of surround tissue and plane of abscess cavity somewhat difficult to identify.  Anteriorly, a 2.5cm (AP) x 1.5cm (ML) growth was noted eminating from the clitoris, not fixed, and mobile from underlying tissue and bone.   Procedure: The patient was identified in the preoperative holding area. Informed consent was signed on the chart. Patient was seen history was reviewed and exam was performed.   The patient was then taken to the operating room and placed in the supine position with SCD hose on. General anesthesia was then induced without difficulty. She was then placed in the dorsolithotomy position. Initial time out was performed so that pap test could be done.   The perineum was prepped with Betadine. The vagina was prepped with Betadine. The patient was then draped after the prep was dried.   Timeout was performed the patient, procedure, antibiotic, allergy,  and length of procedure. Attention was first turned posteriorly to the Bartholin's abscess. A 2 cm incision was made with the scalpel just inside the hymen. Blunt dissection with a digit and a hemostat was performed to outline the cavity of the abscess. Once fully explored, the cavity was copiously irrigated and two biopsies were taken from the posterior cyst wall. Interrupted 2-0 Vicryl was then used to perform marsupialization and secure the cyst wall to the skin edge. The incision was packed with 1/2 inch iodoform guaze.   Attention was then turned anteriorly and a marking pen was used to draw an area around arrowhead lesion eminating from the clitoris, biopsied previously and c/w vulvar cancer. Area around the lesions was measured at 1 cm or more.1% lidocaine was injected for local anesthesia. The 15 blade scalpel was used to make an incision through the skin circumferentially as marked. The skin elipse was grasped and was separated from the underlying subcutaneous tissue with monopolar electrocautery, aiming to achieve a sufficient negative margin deep to the lesion. Given the lesion's involvement of the clitoris, the superficial clitoris had to be taken but as much of the underlying stalk that could be was preserved. After the specimen had been completely resected, it was oriented and marked at 12 o'clock with a 0-vicryl suture. An additional margin was taken at the superior aspect of the incision and at the left lateral aspect of the incision. The bovie was used to obtain hemostasis at the surgical bed as well as a running 2-0 Vicryl stitch. The subcutaneous tissues were irrigated and made hemostatic.   The deep dermal layer was approximated with 3-0 vicryl mattress sutures to bring the skin edges into approximation and off tension. The wound  was closed following langher's lines. The cutaneous layer was closed with interrupted 4-0 vicryl stitches and mattress sutures to ensure a tension free and  hemostatic closure. The perineum was again irrigated. An I&O catheterization was performed.  All instrument, suture, laparotomy, Ray-Tec, and needle counts were correct x2. The patient tolerated the procedure well and was taken recovery room in stable condition.   Jeral Pinch MD Gynecologic Oncology

## 2021-01-04 NOTE — Anesthesia Procedure Notes (Signed)
Procedure Name: Intubation Date/Time: 01/04/2021 7:29 AM Performed by: Raenette Rover, CRNA Pre-anesthesia Checklist: Patient identified, Emergency Drugs available, Suction available and Patient being monitored Patient Re-evaluated:Patient Re-evaluated prior to induction Oxygen Delivery Method: Circle system utilized Preoxygenation: Pre-oxygenation with 100% oxygen Induction Type: IV induction Ventilation: Mask ventilation without difficulty Laryngoscope Size: Mac and 3 Grade View: Grade II Tube type: Oral Tube size: 7.0 mm Number of attempts: 1 Airway Equipment and Method: Stylet Placement Confirmation: ETT inserted through vocal cords under direct vision, positive ETCO2 and breath sounds checked- equal and bilateral Secured at: 21 cm Tube secured with: Tape Dental Injury: Teeth and Oropharynx as per pre-operative assessment

## 2021-01-04 NOTE — Interval H&P Note (Signed)
History and Physical Interval Note:  01/04/2021 7:02 AM  Christina [name]  has presented today for surgery, with the diagnosis of VULVAR CANCER.  The various methods of treatment have been discussed with the patient and family. After consideration of risks, benefits and other options for treatment, the patient has consented to  Procedure(s): WIDE EXCISION VULVECTOMY WITH REMOVAL OF CLITORIS (N/A) BARTHOLIN CYST MARSUPIALIZATION VERSES EXCISION (N/A) PAPSMEAR OF CERVIX (N/A) as a surgical intervention.  The patient's history has been reviewed, patient examined, no change in status, stable for surgery.  I have reviewed the patient's chart and labs.  Questions were answered to the patient's satisfaction.     [name]

## 2021-01-04 NOTE — Anesthesia Postprocedure Evaluation (Signed)
Anesthesia Post Note  Patient: Christina [name]  Procedure(s) Performed: WIDE EXCISION VULVECTOMY WITH REMOVAL OF CLITORIS BARTHOLIN CYST MARSUPIALIZATION PAPSMEAR OF CERVIX     Patient location during evaluation: PACU Anesthesia Type: General Level of consciousness: awake Pain management: pain level controlled Vital Signs Assessment: post-procedure vital signs reviewed and stable Respiratory status: spontaneous breathing Cardiovascular status: stable Postop Assessment: no apparent nausea or vomiting Anesthetic complications: no   No notable events documented.  Last Vitals:  Vitals:   01/04/21 1000 01/04/21 1015  BP: 122/87 114/88  Pulse: 82 85  Resp: 12 (!) 21  Temp:    SpO2: 93% 93%    Last Pain:  Vitals:   01/04/21 1015  TempSrc:   PainSc: 2                  John F Salome Arnt

## 2021-01-04 NOTE — Transfer of Care (Signed)
Immediate Anesthesia Transfer of Care Note  Patient: Christina [name]  Procedure(s) Performed: WIDE EXCISION VULVECTOMY WITH REMOVAL OF CLITORIS BARTHOLIN CYST MARSUPIALIZATION PAPSMEAR OF CERVIX  Patient Location: PACU  Anesthesia Type:General  Level of Consciousness: awake, alert , oriented, drowsy and patient cooperative  Airway & Oxygen Therapy: Patient Spontanous Breathing and Patient connected to face mask oxygen  Post-op Assessment: Report given to RN and Post -op Vital signs reviewed and stable  Post vital signs: Reviewed and stable  Last Vitals:  Vitals Value Taken Time  BP 134/93 01/04/21 0907  Temp    Pulse 90 01/04/21 0910  Resp 11 01/04/21 0910  SpO2 94 % 01/04/21 0910  Vitals shown include unvalidated device data.  Last Pain:  Vitals:   01/04/21 0541  TempSrc: Oral  PainSc:       Patients Stated Pain Goal: 2 (78/[id number]/84 7841)  Complications: No notable events documented.

## 2021-01-04 NOTE — Brief Op Note (Signed)
01/04/2021  9:01 AM  PATIENT:  Christina [name]  45 y.o. female  PRE-OPERATIVE DIAGNOSIS:  VULVAR CANCER  POST-OPERATIVE DIAGNOSIS:  VULVAR CANCER  PROCEDURE:  Procedure(s): WIDE EXCISION VULVECTOMY WITH REMOVAL OF CLITORIS (N/A) BARTHOLIN CYST MARSUPIALIZATION (N/A) PAPSMEAR OF CERVIX (N/A)  SURGEON:  Surgeon(s) and Role:    * [name], MD - Primary    Lahoma Crocker, MD - Assisting  EBL:  200 mL   BLOOD ADMINISTERED:none  DRAINS: none   LOCAL MEDICATIONS USED:  MARCAINE     SPECIMEN:  Bartholin's abscess biopsy, anterior vulva including clitoris, superior and left lateral margin  DISPOSITION OF SPECIMEN:  PATHOLOGY  COUNTS:  YES  TOURNIQUET:  * No tourniquets in log *  DICTATION: .Note written in EPIC  PLAN OF CARE: Discharge to home after PACU  PATIENT DISPOSITION:  PACU - hemodynamically stable.   Delay start of Pharmacological VTE agent (>24hrs) due to surgical blood loss or risk of bleeding: not applicable

## 2021-01-04 NOTE — Telephone Encounter (Signed)
Spoke with patients husband re: appointment 01/05/21. Appointment moved from 4pm to 3pm. He verbalized understanding and is in agreement of time. Instructed to call with any needs.

## 2021-01-05 ENCOUNTER — Other Ambulatory Visit: Payer: Self-pay

## 2021-01-05 ENCOUNTER — Encounter (HOSPITAL_COMMUNITY): Payer: Self-pay | Admitting: Gynecologic Oncology

## 2021-01-05 ENCOUNTER — Inpatient Hospital Stay (HOSPITAL_BASED_OUTPATIENT_CLINIC_OR_DEPARTMENT_OTHER): Payer: BC Managed Care – PPO | Admitting: Gynecologic Oncology

## 2021-01-05 ENCOUNTER — Telehealth: Payer: Self-pay | Admitting: Gynecologic Oncology

## 2021-01-05 VITALS — BP 114/71 | HR 88 | Temp 97.7°F | Resp 16 | Ht 69.0 in | Wt 229.0 lb

## 2021-01-05 DIAGNOSIS — Z5189 Encounter for other specified aftercare: Secondary | ICD-10-CM

## 2021-01-05 DIAGNOSIS — C519 Malignant neoplasm of vulva, unspecified: Secondary | ICD-10-CM

## 2021-01-05 DIAGNOSIS — N751 Abscess of Bartholin's gland: Secondary | ICD-10-CM

## 2021-01-05 LAB — SURGICAL PATHOLOGY

## 2021-01-05 NOTE — Progress Notes (Signed)
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 Jeral Pinch. She is being seen in the office today for evaluation of the surgical wound and to instruct the patient and husband on daily dressing changes. She states she is feeling better. She is tolerating her diet. No issues with emptying her bladder. She has been using her peri bottle. Last BM was two days ago and she has sennakot at home. She has oxycodone as needed and states she has only had to use this maybe every eight hours. She has been taking tylenol and ibuprofen as well. No concerns voiced.  Exam: Alert, oriented, in no acute distress. Appears more comfortable from first office visit. Dr. Berline Lopes in the room. The packing was removed from the opening on the left vulva. Surrounding edges are mildly erythematous. No significant drainage or pus-like drainage noted. The wound was irrigated with sterile normal saline. 1/2 iodoform packing placed in the opening, lightly packed. Husband instructed and verbalizing understanding. Patient reported burning sensation during the exam but tolerated well.  Assessment/Plan: Final pathology discussed with patient and husband by Dr. Berline Lopes. Recommendation for PET scan in 3-4 weeks from surgery to allow for healing and infection to resolve. A referral will be placed for the patient to be seen at [facility] for consideration of the sentinel lymph node procedure for vulvar cancer. She is advised to follow up next week or sooner if needed. She is advised to call for any needs or concerns. Supplies given for dressing changes.

## 2021-01-05 NOTE — Patient Instructions (Signed)
Plan to pack the area once a day. Use the peri bottle after urinating and having a bowel movement. If you notice the packing is soiled, you need to remove it and wait for your husband to get home to re-pack it.   Instructions: Take a pain pill 30-45 minutes before the dressing change and use ice to the area. Wash hands. Remove the packing. Take the empty syringe and fill with normal saline from the bottle provided. Rinse the open area with the saline from the syringe. Get the packing out of the brown bottle and insert the packing into the hole until you meet resistance with the Qtip. Then lightly pack the area and leave a piece long enough for removal for the next dressing changes.  We will work on getting you an appointment to be seen at [facility] for the discussion around surgery for the sentinel lymph node procedure Dr. Berline Lopes discussed. We will also schedule you for a PET scan in 3-4 weeks to evaluate for any spread of the vulvar cancer.   Plan to take the sennakot-S every evening to prevent constipation but do not take if having loose stools. You can alternate Tylenol and ibuprofen around the clock if needed. We will plan to see you back in the office next week or sooner if needed.

## 2021-01-05 NOTE — Telephone Encounter (Signed)
Spoke with patient at her visit today for first packing change to be able to show her husband how to be able to do the changes.  Discussed pathology, which is back from surgery yesterday.  Biopsy from the bed of her Bartholin's abscess is negative for any malignancy.  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  Given depth of invasion of more than 1 mm, I discussed need for lymph node evaluation.  Will order PET scan and aim to schedule this in about 3 weeks after her infection is treated.  We will also plan to refer her to one of my partners at [facility] to be able to have sentinel lymph node evaluation at the time of reexcision to hopefully achieve negative margins from a high-grade dysplasia standpoint.  Jeral Pinch MD Gynecologic Oncology

## 2021-01-07 ENCOUNTER — Telehealth: Payer: Self-pay | Admitting: *Deleted

## 2021-01-07 NOTE — Telephone Encounter (Signed)
Fax referral request and records to Dr Dionne Milo at [facility]

## 2021-01-10 ENCOUNTER — Telehealth: Payer: Self-pay | Admitting: *Deleted

## 2021-01-10 ENCOUNTER — Other Ambulatory Visit: Payer: Self-pay | Admitting: Gynecologic Oncology

## 2021-01-10 ENCOUNTER — Telehealth: Payer: Self-pay

## 2021-01-10 DIAGNOSIS — N751 Abscess of Bartholin's gland: Secondary | ICD-10-CM

## 2021-01-10 DIAGNOSIS — C519 Malignant neoplasm of vulva, unspecified: Secondary | ICD-10-CM

## 2021-01-10 DIAGNOSIS — R52 Pain, unspecified: Secondary | ICD-10-CM

## 2021-01-10 MED ORDER — OXYCODONE HCL 5 MG PO TABS: 5.0000 mg | ORAL_TABLET | ORAL | 0 refills | Status: DC | PRN

## 2021-01-10 NOTE — Telephone Encounter (Signed)
Received call from Ms. Debski re: pain. Patient states the ibuprofen, tylenol and ice are not helping as much as she'd like. Patient rates her pain as 7-8/10. The pain comes and goes but when it comes it is hard to bear and brings her to tears. Patient reports oxycodone helping but she only has one left. [name], NP notified. A refill for oxycodone will be sent to patients pharmacy. Instructed patient to contact our office with any questions, concerns or persistent pain. Patient verbalized understanding and provided with after hours number [phone].

## 2021-01-10 NOTE — Telephone Encounter (Signed)
Per Dr Berline Lopes scheduled the patient for a CT scan on 12/7. Called and gave the patient appt date/time/instruction. Patient will requested the appt with Dr Dionne Milo try to be moved to the [facility] office verus [place]. Explained that the records were fax to [facility] office per her request. Patient stated "I have an appt with Dr Dionne Milo at [facility] on 12/6." Explained that she can still keep that appt and request surgery to be at [facility]. Patient to pick up contrast this week

## 2021-01-10 NOTE — Progress Notes (Signed)
See RN note. 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 Jeral Pinch. She is continuing to use tylenol and ibuprofen alternating. She is using the oxycodone sparingly and when needing dressing changes with packing.

## 2021-01-10 NOTE — Progress Notes (Signed)
Per [employer], she requires CT imaging before having a PET scan. The PET scan will remain as pending with her insurance company and may be approved based on results of CT scan.

## 2021-01-11 ENCOUNTER — Telehealth: Payer: Self-pay

## 2021-01-11 ENCOUNTER — Encounter: Payer: Self-pay | Admitting: Gynecologic Oncology

## 2021-01-11 ENCOUNTER — Inpatient Hospital Stay: Payer: BC Managed Care – PPO | Admitting: Gynecologic Oncology

## 2021-01-11 LAB — CYTOLOGY - PAP: Diagnosis: NEGATIVE

## 2021-01-11 NOTE — Telephone Encounter (Signed)
Following up with Ms. Radice YB:NLWH. Patient reports doing well, she feels more sore today.  Patients husband has been assisting her to clean and pack the wound. He thinks a stitch may have been pulled and they have been unable to pack the wound for the last 2 days. When placing the packing it causes the wound to bleed and packing will not stay in anymore and just falls out. They have been cleaning the wound multiple times a day with saline syringes. She denies redness, swelling, odor or discharge at the site. Denies fever or chills. She has occasional pink spotting. [name], NP notified. Instructed patient to continue using the saline syringes to clean the site and we will assess the wound at her follow up appointment with Dr. Berline Lopes tomorrow 01/12/21 at 1pm. Patient verbalized understanding, instructed to call with any questions or concerns.

## 2021-01-12 ENCOUNTER — Other Ambulatory Visit: Payer: Self-pay

## 2021-01-12 ENCOUNTER — Inpatient Hospital Stay (HOSPITAL_BASED_OUTPATIENT_CLINIC_OR_DEPARTMENT_OTHER): Payer: BC Managed Care – PPO | Admitting: Gynecologic Oncology

## 2021-01-12 VITALS — BP 130/70 | HR 95 | Temp 97.7°F | Resp 16 | Ht 69.0 in | Wt 230.0 lb

## 2021-01-12 DIAGNOSIS — C519 Malignant neoplasm of vulva, unspecified: Secondary | ICD-10-CM

## 2021-01-12 DIAGNOSIS — B379 Candidiasis, unspecified: Secondary | ICD-10-CM

## 2021-01-12 DIAGNOSIS — Z7189 Other specified counseling: Secondary | ICD-10-CM

## 2021-01-12 DIAGNOSIS — Z9079 Acquired absence of other genital organ(s): Secondary | ICD-10-CM

## 2021-01-12 DIAGNOSIS — Z5189 Encounter for other specified aftercare: Secondary | ICD-10-CM

## 2021-01-12 MED ORDER — FLUCONAZOLE 150 MG PO TABS: 150.0000 mg | ORAL_TABLET | Freq: Every day | ORAL | 0 refills | Status: AC

## 2021-01-12 MED ORDER — CEFDINIR 300 MG PO CAPS
300.0000 mg | ORAL_CAPSULE | Freq: Two times a day (BID) | ORAL | 0 refills | Status: AC
Start: 2021-01-12 — End: 2021-01-17

## 2021-01-12 NOTE — Progress Notes (Signed)
Gynecologic Oncology Return Clinic Visit  01/12/2021  Reason for Visit: Wound check  Treatment History: 12/15/2020: Biopsy from periclitoral region revealed at least superficially invasive squamous cell carcinoma, p16 strongly positive. 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  Final pathology shows a 1.8 cm invasive squamous cell carcinoma with associated with high-grade vulvar intraepithelial neoplasia, surgical margins negative for invasive carcinoma.  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  Focal lymphovascular space involvement noted.  Depth of invasion is 5 mm.  12/7: CT scan scheduled  12/8: [facility] appointment to discuss surgery with bilateral inguinal sentinel lymph node biopsy, reexcision of vulvar lesion.  Interval History: Patient presents today for an incision check.  She notes that overall she was continuing to progress until yesterday.  Yesterday, she thinks she overdid it from an activity standpoint and then was coughing forcefully at night and felt like she tore something.  Since then, she has had pain along the left side of her vulva and vagina.  For the last couple of days, her husband has been able to put a small amount of packing in the abscess but the packing falls out within several hours when the patient is up moving around.  They have continued to do twice daily flushes with sterile fluid.  He denies seeing any purulent discharge.  The patient notes having some milky discharge starting last night, which she thinks may be vaginal.  She denies any fevers or chills.  She endorses normal bowel and bladder function.  Her appetite is overall improving.  She finished her antibiotics 2 days ago.  Past  Medical/Surgical History: Past Medical History:  Diagnosis Date   Bartholin's gland abscess    Complication of anesthesia    Difficult to wake up   Diabetes mellitus without complication (HCC)    GERD (gastroesophageal reflux disease)    Hyperlipidemia    SVT (supraventricular tachycardia) ([place])    Vulvar cancer ([place])     Past Surgical History:  Procedure Laterality Date   ABDOMINAL HYSTERECTOMY     ABSCESS DRAINAGE     APPENDECTOMY     BARTHOLIN CYST MARSUPIALIZATION N/A 01/04/2021   Procedure: BARTHOLIN CYST MARSUPIALIZATION;  Surgeon: [name], MD;  Location: [facility];  Service: Gynecology;  Laterality: N/A;   BLADDER SUSPENSION     Removal scar tissue     TONSILLECTOMY     VULVECTOMY N/A 01/04/2021   Procedure: WIDE EXCISION VULVECTOMY WITH REMOVAL OF CLITORIS;  Surgeon: [name], MD;  Location: [facility];  Service: Gynecology;  Laterality: N/A;   WISDOM TOOTH EXTRACTION      Family History  Problem Relation Age of Onset   Cancer Mother    Ovarian cancer Mother 61   Colon cancer Mother 63   Heart disease Mother    Heart disease Father    Heart attack Father    Breast cancer Maternal Aunt 41   Breast cancer Maternal Aunt 66    Social History   Socioeconomic History   Marital status: Married    Spouse name: Not on file   Number of children: Not on file   Years of education: Not on file   Highest education level: Not on file  Occupational History   Not on file  Tobacco Use  Smoking status: Every Day    Packs/day: 1.00    Years: 15.00    Pack years: 15.00    Types: Cigarettes   Smokeless tobacco: Never  Vaping Use   Vaping Use: Never used  Substance and Sexual Activity   Alcohol use: Yes    Comment: Rare   Drug use: No   Sexual activity: Not Currently    Birth control/protection: None, Surgical  Other Topics Concern   Not on file  Social History Narrative   Not on file   Social Determinants of Health   Financial Resource  Strain: Not on file  Food Insecurity: Not on file  Transportation Needs: Not on file  Physical Activity: Not on file  Stress: Not on file  Social Connections: Not on file    Current Medications:  Current Outpatient Medications:    Acetaminophen (TYLENOL PO), Take by mouth as needed., Disp: , Rfl:    cefdinir (OMNICEF) 300 MG capsule, Take 1 capsule (300 mg total) by mouth 2 (two) times daily for 5 days., Disp: 10 capsule, Rfl: 0   fluconazole (DIFLUCAN) 150 MG tablet, Take 1 tablet (150 mg total) by mouth daily for 2 doses. Take 1 now, and another dose after you finish additional antibiotics., Disp: 2 tablet, Rfl: 0   ibuprofen (ADVIL) 800 MG tablet, Take 1 tablet (800 mg total) by mouth every 8 (eight) hours as needed for moderate pain. For AFTER surgery, Disp: 30 tablet, Rfl: 0   metFORMIN (GLUCOPHAGE) 500 MG tablet, Take 500 mg by mouth 2 (two) times daily with a meal., Disp: , Rfl:    metoprolol succinate (TOPROL XL) 25 MG 24 hr tablet, Take 1 tablet (25 mg total) by mouth daily., Disp: 90 tablet, Rfl: 2   oxyCODONE (OXY IR/ROXICODONE) 5 MG immediate release tablet, Take 1 tablet (5 mg total) by mouth every 4 (four) hours as needed for severe pain. For post op pain, Do not take and drive, Disp: 15 tablet, Rfl: 0   senna-docusate (SENOKOT-S) 8.6-50 MG tablet, Take 2 tablets by mouth at bedtime. Do not take if having diarrhea (Patient not taking: Reported on 01/11/2021), Disp: 30 tablet, Rfl: 0  Review of Systems: Pertinent positives as per HPI. Denies appetite changes, fevers, chills, fatigue, unexplained weight changes. Denies hearing loss, neck lumps or masses, mouth sores, ringing in ears or voice changes. Denies cough or wheezing.  Denies shortness of breath. Denies chest pain or palpitations. Denies leg swelling. Denies abdominal distention, pain, blood in stools, constipation, diarrhea, nausea, vomiting, or early satiety. Denies pain with intercourse, dysuria, frequency, hematuria  or incontinence. Denies hot flashes.   Denies joint pain, back pain or muscle pain/cramps. Denies itching, rash. Denies dizziness, headaches, numbness or seizures. Denies swollen lymph nodes or glands, denies easy bruising or bleeding. Denies anxiety, depression, confusion, or decreased concentration.  Physical Exam: BP 130/70 (BP Location: Left Arm, Patient Position: Sitting)   Pulse 95   Temp 97.7 F (36.5 C) (Tympanic)   Resp 16   Ht 5\' 9"  (1.753 m)   Wt 230 lb (104.3 kg)   SpO2 97%   BMI 33.97 kg/m  General: Alert, oriented, no acute distress. HEENT: Normocephalic, atraumatic, sclera anicteric. Chest: Unlabored breathing on room air. Cardiovascular: Regular rate and rhythm, no murmurs. Extremities: Grossly normal range of motion.  Warm, well perfused.  No edema bilaterally. Skin: No rashes or lesions noted. GU: Anterior incision of the vulva is overall healing well with sutures still in place, no bleeding or  drainage noted.  The vulva itself looks quite irritated with some white exudate.  The abscess cavity is explored with a Q-tip to a depth of approximately 2 cm, there is some mildly purulent drainage although is unclear whether this is vaginal or from the abscess cavity itself.  The abscess cavity appears to be healing well with some fibrinous exudate but no purulent drainage that I can appreciate within the cavity or upon flushing with sterile saline.  Patient is quite tender with palpation of the area.  Laboratory & Radiologic Studies: None new  Assessment & Plan: Christina [name] is a 45 y.o. woman with at least stage IB SCC of the vulva as well as recently marsupialized Bartholin's abscess.  Based on her symptoms and exam findings, I am concerned that she may still have persistent infection.  I will send some additional days of antibiotics based on cultures done before her surgery to the pharmacy.  I also think that she likely has a yeast infection based on her tenderness and  exam findings.  I have sent 2 doses of Diflucan to her pharmacy, 1 to take now, and 1 to take again after she finishes the additional days of antibiotics.  Given the depth of her abscess cavity, I do not think that packing is necessary or feasible now.  I have recommended that they continue to do twice daily flushes.  I will plan to see the patient back in a week to reassess her incision.  We reviewed again pathology from surgery.  Given that the depth of invasion of her lesion, lymph node evaluation is recommended.  She is scheduled for CT evaluation next week and a referral was placed for her to see one of my partners for discussion of sentinel lymph node biopsy with repeat vulvar excision at [facility].  This consultation is also scheduled for next week.  28 minutes of total time was spent for this patient encounter, including preparation, face-to-face counseling with the patient and coordination of care, and documentation of the encounter.  Jeral Pinch, MD  Division of Gynecologic Oncology  Department of Obstetrics and Gynecology  [facility]

## 2021-01-12 NOTE — Patient Instructions (Signed)
Overall, things are healing well.  Please be careful not to overexert yourself.  Continue washing the wound with sterile fluid twice a day, packing is no longer needed.  Take a dose of the Diflucan, medication for yeast, today and another after you finish the additional 5 days of antibiotics.  I will see you on Monday for another wound check.

## 2021-01-14 ENCOUNTER — Encounter: Payer: Self-pay | Admitting: Gynecologic Oncology

## 2021-01-15 NOTE — Progress Notes (Signed)
Gynecologic Oncology Return Clinic Visit  01/17/21  Reason for Visit: Wound check  Treatment History: 12/15/2020: Biopsy from periclitoral region revealed at least superficially invasive squamous cell carcinoma, p16 strongly positive. 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  Final pathology shows a 1.8 cm invasive squamous cell carcinoma with associated with high-grade vulvar intraepithelial neoplasia, surgical margins negative for invasive carcinoma.  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  Focal lymphovascular space involvement noted.  Depth of invasion is 5 mm.   12/7: CT scan scheduled  12/8: [facility] appointment to discuss surgery with bilateral inguinal sentinel lymph node biopsy, reexcision of vulvar lesion.  Interval History: Patient presents today for follow-up.  Since her visit with me last week, she has continued to feel better with each day.  She took some ibuprofen last night, has not taken any pain medication today.  Denies any bowel or bladder symptoms.  Tolerating regular diet without nausea or emesis.  Has had a little bit of spotting from her incision, denies any purulent drainage.  Past Medical/Surgical History: Past Medical History:  Diagnosis Date   Bartholin's gland abscess    Complication of anesthesia    Difficult to wake up   Diabetes mellitus without complication (HCC)    GERD (gastroesophageal reflux disease)    Hyperlipidemia    SVT (supraventricular tachycardia) ([place])    Vulvar cancer ([place])     Past Surgical History:  Procedure Laterality Date   ABDOMINAL HYSTERECTOMY     ABSCESS DRAINAGE     APPENDECTOMY     BARTHOLIN CYST MARSUPIALIZATION N/A 01/04/2021   Procedure: BARTHOLIN CYST  MARSUPIALIZATION;  Surgeon: [name], MD;  Location: [facility];  Service: Gynecology;  Laterality: N/A;   BLADDER SUSPENSION     Removal scar tissue     TONSILLECTOMY     VULVECTOMY N/A 01/04/2021   Procedure: WIDE EXCISION VULVECTOMY WITH REMOVAL OF CLITORIS;  Surgeon: [name], MD;  Location: [facility];  Service: Gynecology;  Laterality: N/A;   WISDOM TOOTH EXTRACTION      Family History  Problem Relation Age of Onset   Cancer Mother    Ovarian cancer Mother 69   Colon cancer Mother 17   Heart disease Mother    Heart disease Father    Heart attack Father    Breast cancer Maternal Aunt 41   Breast cancer Maternal Aunt 60    Social History   Socioeconomic History   Marital status: Married    Spouse name: Not on file   Number of children: Not on file   Years of education: Not on file   Highest education level: Not on file  Occupational History   Not on file  Tobacco Use   Smoking status: Every Day    Packs/day: 1.00    Years: 15.00    Pack years: 15.00    Types: Cigarettes   Smokeless tobacco: Never  Vaping Use   Vaping Use: Never used  Substance and Sexual Activity   Alcohol use: Yes    Comment: Rare   Drug use: No   Sexual activity: Not Currently    Birth control/protection: None, Surgical  Other Topics Concern   Not on file  Social History Narrative   Not on file   Social  Determinants of Health   Financial Resource Strain: Not on file  Food Insecurity: Not on file  Transportation Needs: Not on file  Physical Activity: Not on file  Stress: Not on file  Social Connections: Not on file    Current Medications:  Current Outpatient Medications:    Acetaminophen (TYLENOL PO), Take by mouth as needed., Disp: , Rfl:    cefdinir (OMNICEF) 300 MG capsule, Take 1 capsule (300 mg total) by mouth 2 (two) times daily for 5 days., Disp: 10 capsule, Rfl: 0   ibuprofen (ADVIL) 800 MG tablet, Take 1 tablet (800 mg total) by mouth every 8 (eight) hours  as needed for moderate pain. For AFTER surgery, Disp: 30 tablet, Rfl: 0   metFORMIN (GLUCOPHAGE) 500 MG tablet, Take 500 mg by mouth 2 (two) times daily with a meal., Disp: , Rfl:    metoprolol succinate (TOPROL XL) 25 MG 24 hr tablet, Take 1 tablet (25 mg total) by mouth daily., Disp: 90 tablet, Rfl: 2   oxyCODONE (OXY IR/ROXICODONE) 5 MG immediate release tablet, Take 1 tablet (5 mg total) by mouth every 4 (four) hours as needed for severe pain. For post op pain, Do not take and drive, Disp: 15 tablet, Rfl: 0   senna-docusate (SENOKOT-S) 8.6-50 MG tablet, Take 2 tablets by mouth at bedtime. Do not take if having diarrhea, Disp: 30 tablet, Rfl: 0  Review of Systems: Denies appetite changes, fevers, chills, fatigue, unexplained weight changes. Denies hearing loss, neck lumps or masses, mouth sores, ringing in ears or voice changes. Denies cough or wheezing.  Denies shortness of breath. Denies chest pain or palpitations. Denies leg swelling. Denies abdominal distention, pain, blood in stools, constipation, diarrhea, nausea, vomiting, or early satiety. Denies pain with intercourse, dysuria, frequency, hematuria or incontinence. Denies hot flashes, pelvic pain, vaginal bleeding or vaginal discharge.   Denies joint pain, back pain or muscle pain/cramps. Denies itching, rash, or wounds. Denies dizziness, headaches, numbness or seizures. Denies swollen lymph nodes or glands, denies easy bruising or bleeding. Denies anxiety, depression, confusion, or decreased concentration.  Physical Exam: BP 106/71 (BP Location: Left Arm, Patient Position: Sitting)   Pulse 95   Temp 97.8 F (36.6 C) (Oral)   Resp 14   Ht 5\' 9"  (1.753 m)   Wt 229 lb (103.9 kg)   SpO2 98%   BMI 33.82 kg/m  General: Alert, oriented, no acute distress. HEENT: Normocephalic, atraumatic, sclera anicteric. Chest: Unlabored breathing on room air. GU: Anterior incision of the vulva is overall healing well with sutures still in  place although incision opened in several places due to a loss of sutures, no bleeding or drainage noted.  The vulva itself still with some white exudate, markedly less irritated than at her visit last week.  The abscess cavity is explored with a Q-tip to a depth of approximately 1 cm, no discharge or purulent drainage noted.  The abscess cavity appears to be healing well with some fibrinous exudate but no purulent drainage that I can appreciate within the cavity.  Patient is mildly tender to palpation of the area, significantly less so than at her last exam.  Laboratory & Radiologic Studies: None new  Assessment & Plan: Christina [name] is a 45 y.o. woman with at least stage IB SCC of the vulva as well as recently marsupialized Bartholin's abscess.   Patient has had significant improvement with decreased pain and tenderness on exam today.  Abscess cavity continues to heal well without any evidence of purulent  discharge on exam.  She is continuing her additional course of antibiotic therapy and will take the second dose of Diflucan once she finishes this.  We talked about continuing to flush the abscess cavity with sterile fluid versus doing 2 sitz bath's a day.  We also discussed anterior resection where several sutures have fallen out.  The incision itself is healing well without evidence of infection.  Patient was given contrast for CT scan later this week.  I will plan to see her for follow-up and exam of her Bartholin's abscess cavity in approximately 2 weeks.  28 minutes of total time was spent for this patient encounter, including preparation, face-to-face counseling with the patient and coordination of care, and documentation of the encounter.  Jeral Pinch, MD  Division of Gynecologic Oncology  Department of Obstetrics and Gynecology  [facility]

## 2021-01-17 ENCOUNTER — Encounter: Payer: Self-pay | Admitting: Gynecologic Oncology

## 2021-01-17 ENCOUNTER — Inpatient Hospital Stay: Payer: BC Managed Care – PPO | Attending: Gynecologic Oncology | Admitting: Gynecologic Oncology

## 2021-01-17 ENCOUNTER — Other Ambulatory Visit: Payer: Self-pay

## 2021-01-17 VITALS — BP 106/71 | HR 95 | Temp 97.8°F | Resp 14 | Ht 69.0 in | Wt 229.0 lb

## 2021-01-17 DIAGNOSIS — N751 Abscess of Bartholin's gland: Secondary | ICD-10-CM | POA: Diagnosis not present

## 2021-01-17 DIAGNOSIS — Z800 Family history of malignant neoplasm of digestive organs: Secondary | ICD-10-CM

## 2021-01-17 DIAGNOSIS — C519 Malignant neoplasm of vulva, unspecified: Secondary | ICD-10-CM | POA: Diagnosis not present

## 2021-01-17 DIAGNOSIS — Z9079 Acquired absence of other genital organ(s): Secondary | ICD-10-CM | POA: Diagnosis not present

## 2021-01-17 NOTE — Patient Instructions (Addendum)
Things continue to look like they are healing well!  Please let me know if anything changes.  I will plan to see you in 2 weeks to check your abscess incision 1 more time.  Please finish your antibiotics and then take the second dose of medication for yeast.

## 2021-01-18 ENCOUNTER — Telehealth: Payer: Self-pay

## 2021-01-18 NOTE — Telephone Encounter (Signed)
Called patient to see if her other provider was reaching out for surgical clearance or if they were just keeping Dr. Harriet Masson informed.  Pt states she just recently had surgery and will be undergoing another surgery soon. "I just think they are keeping Dr. Harriet Masson in the loop since she is my cardiologist." Pt also states we have moved to [place] to [place] now back to [place] do to my husband's job and his parents getting sick. When asked if the pt wasn't to schedule an appointment with Dr. Harriet Masson she states she will call back since she will have so many upcoming appointment and is unsure when she will be able to come see Dr. Harriet Masson. She thanked me for calling her to check in on her.

## 2021-01-19 ENCOUNTER — Ambulatory Visit (HOSPITAL_COMMUNITY)
Admission: RE | Admit: 2021-01-19 | Discharge: 2021-01-19 | Disposition: A | Discharge status: Home / Self Care | Payer: BC Managed Care – PPO | Source: Ambulatory Visit | Attending: Gynecologic Oncology | Admitting: Gynecologic Oncology

## 2021-01-19 DIAGNOSIS — C519 Malignant neoplasm of vulva, unspecified: Secondary | ICD-10-CM | POA: Insufficient documentation

## 2021-01-19 MED ORDER — IOHEXOL 350 MG/ML SOLN
80.0000 mL | Freq: Once | INTRAVENOUS | Status: AC | PRN
  Administered 2021-01-19: 80 mL via INTRAVENOUS

## 2021-01-19 MED ORDER — SODIUM CHLORIDE (PF) 0.9 % IJ SOLN
INTRAMUSCULAR | Status: AC
  Filled 2021-01-19: qty 50

## 2021-01-24 ENCOUNTER — Inpatient Hospital Stay: Payer: BC Managed Care – PPO | Admitting: Gynecologic Oncology

## 2021-01-27 ENCOUNTER — Telehealth: Payer: Self-pay | Admitting: *Deleted

## 2021-01-27 NOTE — Telephone Encounter (Signed)
Per Dr Berline Lopes canceled PET scan and notified the patient

## 2021-01-28 ENCOUNTER — Encounter: Payer: Self-pay | Admitting: Gynecologic Oncology

## 2021-01-31 ENCOUNTER — Other Ambulatory Visit: Payer: Self-pay

## 2021-01-31 ENCOUNTER — Ambulatory Visit (HOSPITAL_COMMUNITY): Payer: BC Managed Care – PPO

## 2021-01-31 ENCOUNTER — Inpatient Hospital Stay (HOSPITAL_BASED_OUTPATIENT_CLINIC_OR_DEPARTMENT_OTHER): Payer: BC Managed Care – PPO | Admitting: Gynecologic Oncology

## 2021-01-31 ENCOUNTER — Encounter: Payer: Self-pay | Admitting: Gynecologic Oncology

## 2021-01-31 VITALS — BP 124/74 | HR 102 | Temp 97.6°F | Resp 16 | Ht 69.0 in | Wt 221.0 lb

## 2021-01-31 DIAGNOSIS — B9689 Other specified bacterial agents as the cause of diseases classified elsewhere: Secondary | ICD-10-CM

## 2021-01-31 DIAGNOSIS — B379 Candidiasis, unspecified: Secondary | ICD-10-CM

## 2021-01-31 DIAGNOSIS — Z809 Family history of malignant neoplasm, unspecified: Secondary | ICD-10-CM

## 2021-01-31 DIAGNOSIS — N760 Acute vaginitis: Secondary | ICD-10-CM

## 2021-01-31 DIAGNOSIS — C519 Malignant neoplasm of vulva, unspecified: Secondary | ICD-10-CM

## 2021-01-31 MED ORDER — METRONIDAZOLE 500 MG PO TABS
500.0000 mg | ORAL_TABLET | Freq: Two times a day (BID) | ORAL | 0 refills | Status: AC
Start: 2021-01-31 — End: ?

## 2021-01-31 MED ORDER — FLUCONAZOLE 150 MG PO TABS
150.0000 mg | ORAL_TABLET | Freq: Every day | ORAL | 0 refills | Status: DC
Start: 2021-01-31 — End: 2021-02-15

## 2021-01-31 NOTE — Progress Notes (Signed)
Gynecologic Oncology Return Clinic Visit  01/31/2021  Reason for Visit: Follow-up after surgery  Treatment History: 12/15/2020: Biopsy from periclitoral region revealed at least superficially invasive squamous cell carcinoma, p16 strongly positive. 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  Final pathology shows a 1.8 cm invasive squamous cell carcinoma with associated with high-grade vulvar intraepithelial neoplasia, surgical margins negative for invasive carcinoma.  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  Focal lymphovascular space involvement noted.  Depth of invasion is 5 mm.   12/7: CT scan without definitive evidence of metastatic disease.  Small volume pelvic fluid noted as well as adnexal cyst.  12/29: [facility] appointment to discuss surgery with bilateral inguinal sentinel lymph node biopsy, reexcision of vulvar lesion.   Interval History: Patient reports doing well until 4 days ago.  At that time, she was using a pillow that can be placed inside the microwave that unfortunately had lavender on it.  She is allergic to lavender and has been having what she feels is a rash/reaction as well as pruritus on her vulva.  Overall, pain had been decreasing until 4 days ago.  She is still having some minimal spotting and possible discharge from both the surgical incisions.  She reports regular bowel and bladder function.  Continuing to rinse after she uses the bathroom and she and her husband are using sterile fluid over the incision once a day.  Past Medical/Surgical History: Past Medical History:  Diagnosis Date   Bartholin's gland abscess    Complication of anesthesia    Difficult to wake up   Diabetes mellitus without  complication (HCC)    GERD (gastroesophageal reflux disease)    Hyperlipidemia    SVT (supraventricular tachycardia) ([place])    Vulvar cancer ([place])     Past Surgical History:  Procedure Laterality Date   ABDOMINAL HYSTERECTOMY     ABSCESS DRAINAGE     APPENDECTOMY     BARTHOLIN CYST MARSUPIALIZATION N/A 01/04/2021   Procedure: BARTHOLIN CYST MARSUPIALIZATION;  Surgeon: [name], MD;  Location: [facility];  Service: Gynecology;  Laterality: N/A;   BLADDER SUSPENSION     Removal scar tissue     TONSILLECTOMY     VULVECTOMY N/A 01/04/2021   Procedure: WIDE EXCISION VULVECTOMY WITH REMOVAL OF CLITORIS;  Surgeon: [name], MD;  Location: [facility];  Service: Gynecology;  Laterality: N/A;   WISDOM TOOTH EXTRACTION      Family History  Problem Relation Age of Onset   Cancer Mother    Ovarian cancer Mother 61   Colon cancer Mother 39   Heart disease Mother    Heart disease Father    Heart attack Father    Breast cancer Maternal Aunt 41   Breast cancer Maternal Aunt 40    Social History   Socioeconomic History   Marital status: Married    Spouse name: Not on file   Number of children: Not on file   Years of education: Not on file   Highest education level: Not on file  Occupational History   Not on file  Tobacco Use   Smoking status: Every Day    Packs/day: 1.00    Years: 15.00    Pack years: 15.00    Types: Cigarettes   Smokeless tobacco: Never  Vaping Use   Vaping Use: Never used  Substance and Sexual Activity   Alcohol use: Yes    Comment: Rare   Drug use: No   Sexual activity: Not Currently    Birth control/protection: None, Surgical  Other Topics Concern   Not on file  Social History Narrative   Not on file   Social Determinants of Health   Financial Resource Strain: Not on file  Food Insecurity: Not on file  Transportation Needs: Not on file  Physical Activity: Not on file  Stress: Not on file  Social Connections: Not on file     Current Medications:  Current Outpatient Medications:    Acetaminophen (TYLENOL PO), Take by mouth as needed., Disp: , Rfl:    fluconazole (DIFLUCAN) 150 MG tablet, Take 1 tablet (150 mg total) by mouth daily., Disp: 2 tablet, Rfl: 0   metFORMIN (GLUCOPHAGE) 500 MG tablet, Take 500 mg by mouth 2 (two) times daily with a meal., Disp: , Rfl:    metroNIDAZOLE (FLAGYL) 500 MG tablet, Take 1 tablet (500 mg total) by mouth 2 (two) times daily., Disp: 10 tablet, Rfl: 0   metoprolol succinate (TOPROL XL) 25 MG 24 hr tablet, Take 1 tablet (25 mg total) by mouth daily. (Patient not taking: Reported on 01/28/2021), Disp: 90 tablet, Rfl: 2   senna-docusate (SENOKOT-S) 8.6-50 MG tablet, Take 2 tablets by mouth at bedtime. Do not take if having diarrhea (Patient not taking: Reported on 01/28/2021), Disp: 30 tablet, Rfl: 0  Review of Systems: Pertinent positives include pruritus, dysuria, rash. Denies appetite changes, fevers, chills, fatigue, unexplained weight changes. Denies hearing loss, neck lumps or masses, mouth sores, ringing in ears or voice changes. Denies cough or wheezing.  Denies shortness of breath. Denies chest pain or palpitations. Denies leg swelling. Denies abdominal distention, pain, blood in stools, constipation, diarrhea, nausea, vomiting, or early satiety. Denies pain with intercourse, frequency, hematuria or incontinence. Denies hot flashes, pelvic pain, vaginal bleeding or vaginal discharge.   Denies joint pain, back pain or muscle pain/cramps. Denies wounds. Denies dizziness, headaches, numbness or seizures. Denies swollen lymph nodes or glands, denies easy bruising or bleeding. Denies anxiety, depression, confusion, or decreased concentration.  Physical Exam: BP 124/74 (BP Location: Left Arm, Patient Position: Sitting)    Pulse (!) 102    Temp 97.6 F (36.4 C) (Tympanic)    Resp 16    Ht 5\' 9"  (1.753 m)    Wt 221 lb (100.2 kg)    SpO2 98%    BMI 32.64 kg/m  General:  Alert, oriented, no acute distress. HEENT: Normocephalic, atraumatic, sclera anicteric. Chest: Unlabored breathing on room air. Extremities: Grossly normal range of motion.  Warm, well perfused.  No edema bilaterally. GU: Anterior vulvar incision healing well, several sutures still in place.  Minimal discharge around the incision.  There is significant erythema underneath the incision and superior to the urethra as well as some white exudate consistent with Candida infection.  The marsupialized Bartholin's abscess has almost closed.  There is some tenderness with palpation over this area, no significant edema or erythema.  Scant vaginal discharge noted.  Laboratory & Radiologic Studies: None new  Assessment & Plan: Christina [name] is a 45 y.o. woman with at least stage IB SCC of the vulva as well as recently marsupialized Bartholin's abscess.  Patient has had continued significant improvement in terms of healing of both incisions.  Given exam findings, I think that she has Candida infection again.  I sent 2  doses of Diflucan to her pharmacy, 1 to use now, and 1 if she were to start having symptoms again.  Given vaginal discharge as well as multiple courses of antibiotics, I also recommended that we treat with Flagyl for 5-day course.   Given degree of healing, I think the patient can stop irrigating the area daily.  I encouraged her to continue rinsing with the squirt bottle after she urinates or has a bowel movement.  She unfortunately missed her [facility] appointment because of car trouble.  She was rescheduled to see Dr. Dionne Milo on the 29th.  We reviewed CT findings, which showed no evidence of metastatic disease in terms of her vulvar cancer.  I will see her back again next week to assure continued improvement of her symptoms.  32 minutes of total time was spent for this patient encounter, including preparation, face-to-face counseling with the patient and coordination of care, and documentation of the  encounter.  Jeral Pinch, MD  Division of Gynecologic Oncology  Department of Obstetrics and Gynecology  [facility]

## 2021-01-31 NOTE — Patient Instructions (Signed)
It was good to see you today.  Overall, your incisions are healing very well.   You have some irritation that is concerning for another yeast infection.  I am sending 2 doses of Diflucan to your pharmacy.  Please take 1 now and save one if symptoms return. I'm also sending 5 days worth of Flagyl, to treat changes in vaginal flora.  I will see you next week for another exam.

## 2021-02-01 ENCOUNTER — Telehealth: Payer: Self-pay

## 2021-02-01 NOTE — Telephone Encounter (Signed)
CT scan results from 01/19/21 faxed successfully to [facility].

## 2021-02-09 ENCOUNTER — Other Ambulatory Visit: Payer: Self-pay

## 2021-02-09 ENCOUNTER — Inpatient Hospital Stay (HOSPITAL_BASED_OUTPATIENT_CLINIC_OR_DEPARTMENT_OTHER): Payer: BC Managed Care – PPO | Admitting: Gynecologic Oncology

## 2021-02-09 ENCOUNTER — Encounter: Payer: Self-pay | Admitting: Gynecologic Oncology

## 2021-02-09 VITALS — BP 127/71 | HR 92 | Temp 97.6°F | Resp 18 | Ht 69.0 in | Wt 220.0 lb

## 2021-02-09 DIAGNOSIS — C519 Malignant neoplasm of vulva, unspecified: Secondary | ICD-10-CM

## 2021-02-09 DIAGNOSIS — Z5189 Encounter for other specified aftercare: Secondary | ICD-10-CM

## 2021-02-09 NOTE — Patient Instructions (Signed)
You are healing well. Continue with your peri care. If you develop itching etc, please call the office and an additional dose of diflucan will be sent in. Plan to see Dr. Dionne Milo tomorrow at [facility] to arrange for surgery. Please call the office for any questions or concerns.

## 2021-02-09 NOTE — Progress Notes (Signed)
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 Jeral Pinch.  Final pathology shows a 1.8 cm invasive squamous cell carcinoma with associated with high-grade vulvar intraepithelial neoplasia, surgical margins negative for invasive carcinoma.  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  Focal lymphovascular space involvement noted.  Depth of invasion is 5 mm. She had a CT scan on 12/7 showing no definitive evidence of metastatic disease. She is being seen in the office today for evaluation of the surgical wound. She last saw Dr. Berline Lopes on 01/31/2021 and was prescribed diflucan and a 5 day course of flagyl due to vaginal discharge and a vulvar candida infection. She is scheduled to meet with Dr. Dionne Milo at [facility] on December 29 to discuss surgery with bilateral inguinal sentinel lymph node biopsy, reexcision of vulvar lesion.   She states she has been doing well since her last visit except for having the flu recently. She feels she is recovering from this. She has had no issues since her last visit and states the medications seemed to help. No fever, chills. Appetite good with no nausea or emesis. Bowels and bladder functioning without difficulty. She continues to use her peri bottle and perform peri care throughout the day. No drainage or pain reported from the vulva. She has intermittent shooting twinges in the area but states these are improving. All questions answered.  Exam: Alert, oriented, in no acute distress. Lungs clear. Heart regular in rate and rhythm.  Anterior vulvar incision healing well, several sutures still in place. Mild agglutination of the labia with sutures  noted underneath.  Minimal discharge around the incision. The marsupialized Bartholin's abscess has almost closed with no tenderness with palpation over this area, no significant edema or erythema. Minimal amount of white drainage noted on the posterior vulva- no itching or erythema.   Assessment/Plan: She is healing well with no intervention needed at today's visit. She is scheduled for consultation at [facility] with Dr. Dionne Milo for consideration of the sentinel lymph node procedure, vulvar re-excision for vulvar cancer. Dicussed continued peri care and she is advised to call for any new symptoms such as itching, pain. She is advised to call for any needs or concerns.

## 2021-02-15 ENCOUNTER — Encounter: Payer: Self-pay | Admitting: Genetic Counselor

## 2021-02-15 ENCOUNTER — Inpatient Hospital Stay: Payer: 59 | Attending: Gynecologic Oncology | Admitting: Genetic Counselor

## 2021-02-15 ENCOUNTER — Inpatient Hospital Stay: Payer: 59

## 2021-02-15 ENCOUNTER — Other Ambulatory Visit: Payer: Self-pay | Admitting: Gynecologic Oncology

## 2021-02-15 ENCOUNTER — Telehealth: Payer: Self-pay | Admitting: *Deleted

## 2021-02-15 ENCOUNTER — Other Ambulatory Visit: Payer: Self-pay

## 2021-02-15 DIAGNOSIS — Z808 Family history of malignant neoplasm of other organs or systems: Secondary | ICD-10-CM | POA: Insufficient documentation

## 2021-02-15 DIAGNOSIS — Z90710 Acquired absence of both cervix and uterus: Secondary | ICD-10-CM | POA: Insufficient documentation

## 2021-02-15 DIAGNOSIS — C519 Malignant neoplasm of vulva, unspecified: Secondary | ICD-10-CM | POA: Insufficient documentation

## 2021-02-15 DIAGNOSIS — E119 Type 2 diabetes mellitus without complications: Secondary | ICD-10-CM | POA: Insufficient documentation

## 2021-02-15 DIAGNOSIS — F17210 Nicotine dependence, cigarettes, uncomplicated: Secondary | ICD-10-CM | POA: Insufficient documentation

## 2021-02-15 DIAGNOSIS — Z803 Family history of malignant neoplasm of breast: Secondary | ICD-10-CM

## 2021-02-15 DIAGNOSIS — Z8041 Family history of malignant neoplasm of ovary: Secondary | ICD-10-CM | POA: Insufficient documentation

## 2021-02-15 DIAGNOSIS — Z800 Family history of malignant neoplasm of digestive organs: Secondary | ICD-10-CM

## 2021-02-15 DIAGNOSIS — B379 Candidiasis, unspecified: Secondary | ICD-10-CM

## 2021-02-15 LAB — GENETIC SCREENING ORDER

## 2021-02-15 MED ORDER — FLUCONAZOLE 150 MG PO TABS: 150.0000 mg | ORAL_TABLET | Freq: Every day | ORAL | 0 refills | Status: DC

## 2021-02-15 NOTE — Telephone Encounter (Signed)
Spoke with [name] and informed her that [name], NP has refilled her diflucan. Pt states she is itching today and has had slight pain while she was driving today. Pt rated the pain a 2-3/10. Patient denies any fevers. [place] notified.

## 2021-02-15 NOTE — Progress Notes (Signed)
Patient called requesting refill of diflucan.

## 2021-02-15 NOTE — Telephone Encounter (Signed)
Patient called and request a refill for the diflucan. Patient's pharmacy verified

## 2021-02-15 NOTE — Progress Notes (Addendum)
REFERRING PROVIDER: Jeral Pinch, MD [employer], [place] [address]  PRIMARY PROVIDER:  Pcp, No  PRIMARY REASON FOR VISIT:  Encounter Diagnoses  Name Primary?   Family history of colon cancer in mother Yes   Family history of breast cancer     HISTORY OF PRESENT ILLNESS:   Ms. Chumley, a 46 y.o. female, was seen for a [place] cancer genetics consultation at the request of Dr. Berline Lopes due to a personal and family history of cancer.  Ms. Haslip presents to clinic today to discuss the possibility of a hereditary predisposition to cancer, to discuss genetic testing, and to further clarify her future cancer risks, as well as potential cancer risks for family members.   CANCER HISTORY:  In 2022, at the age of 61, Ms. Stephen was diagnosed with vulvar cancer. She reports she had a TAH in 2013 due to precancerous cells in her uterus.   RISK FACTORS:  Menarche: 53 First live birth at age 80.  OCP use for approximately 3-4 years.  Ovaries intact: yes.  Uterus intact: no.  Menopausal status: premenopausal.  HRT use: 0 years. Colonoscopy: no Mammogram within the last year: no, she has had one mammogram to date in ~2019 Number of breast biopsies: 0. Up to date with pelvic exams: yes. Any excessive radiation exposure in the past: no  Past Medical History:  Diagnosis Date   Bartholin's gland abscess    Complication of anesthesia    Difficult to wake up   Diabetes mellitus without complication (HCC)    GERD (gastroesophageal reflux disease)    Hyperlipidemia    SVT (supraventricular tachycardia) ([place])    Vulvar cancer ([place])     Past Surgical History:  Procedure Laterality Date   ABDOMINAL HYSTERECTOMY     ABSCESS DRAINAGE     APPENDECTOMY     BARTHOLIN CYST MARSUPIALIZATION N/A 01/04/2021   Procedure: BARTHOLIN CYST MARSUPIALIZATION;  Surgeon: [name], MD;  Location: [facility];  Service: Gynecology;  Laterality: N/A;   BLADDER SUSPENSION      Removal scar tissue     TONSILLECTOMY     VULVECTOMY N/A 01/04/2021   Procedure: WIDE EXCISION VULVECTOMY WITH REMOVAL OF CLITORIS;  Surgeon: [name], MD;  Location: [facility];  Service: Gynecology;  Laterality: N/A;   WISDOM TOOTH EXTRACTION      Social History   Socioeconomic History   Marital status: Married    Spouse name: Not on file   Number of children: Not on file   Years of education: Not on file   Highest education level: Not on file  Occupational History   Not on file  Tobacco Use   Smoking status: Every Day    Packs/day: 1.00    Years: 15.00    Pack years: 15.00    Types: Cigarettes   Smokeless tobacco: Never  Vaping Use   Vaping Use: Never used  Substance and Sexual Activity   Alcohol use: Yes    Comment: Rare   Drug use: No   Sexual activity: Not Currently    Birth control/protection: None, Surgical  Other Topics Concern   Not on file  Social History Narrative   Not on file   Social Determinants of Health   Financial Resource Strain: Not on file  Food Insecurity: Not on file  Transportation Needs: Not on file  Physical Activity: Not on file  Stress: Not on file  Social Connections: Not on file     FAMILY HISTORY:  We obtained a detailed, 4-generation family history.  Significant diagnoses are listed below: Family History  Problem Relation Age of Onset   Cancer Mother    Ovarian cancer Mother        dx. early 87s   Colon cancer Mother 59   Heart disease Mother    Heart disease Father    Heart attack Father    Breast cancer Maternal Aunt 33   Breast cancer Maternal Aunt 32   Liver cancer Maternal Grandfather       Ms. Maclaughlin reports her mother was diagnosed with ovarian cancer in her early 65s and colon cancer in her early 37s. Her mother died due to metastatic colon cancer at age 40. Ms. Deblanc has five maternal aunts. One was diagnosed with breast cancer at age 88 and a second was diagnosed with breast cancer at age 25. Her  maternal grandfather died due to liver cancer. Her father died in his late 7s due to a MI. Of note, she does not have any information about her paternal family medical history and she has limited information about her maternal family medical history.   Ms. Iannuzzi is unaware of previous family history of genetic testing for hereditary cancer risks. There is  no reported Ashkenazi Jewish ancestry.   GENETIC COUNSELING ASSESSMENT: Ms. Walter is a 46 y.o. female with a personal and family history of cancer which is somewhat suggestive of a hereditary predisposition to cancer. We, therefore, discussed and recommended the following at today's visit.   DISCUSSION: We discussed that 5 - 10% of cancer is hereditary and there are 80+ genes known to be associated with hereditary cancer syndromes.  We discussed that testing is beneficial for several reasons, including knowing about other cancer risks, identifying potential screening and risk-reduction options that may be appropriate, and to understanding if other family members could be at risk for cancer and allowing them to undergo genetic testing.  We reviewed the characteristics, features and inheritance patterns of hereditary cancer syndromes. We also discussed genetic testing, including the appropriate family members to test, the process of testing, insurance coverage and turn-around-time for results. We discussed the implications of a negative, positive, carrier and/or variant of uncertain significant result. We recommended Ms. Kernan pursue genetic testing for a panel that contains genes associated with breast, colon, and ovarian cancer.  Ms. Grajeda was offered a common hereditary cancer panel (47 genes) and an expanded pan-cancer panel (84 genes). Ms. Aguillard was informed of the benefits and limitations of each panel, including that expanded pan-cancer panels contain several genes that do not have clear management guidelines at this point in time.  We  also discussed that as the number of genes included on a panel increases, the chances of variants of uncertain significance increases.  After considering the benefits and limitations of each gene panel, Ms. Felch elected to have [facility]'s Multi-Cancer Panel+RNA.  The Multi-Cancer + RNA Panel offered by [facility] includes sequencing and/or deletion/duplication analysis of the following 84 genes:  AIP*, ALK, APC*, ATM*, AXIN2*, BAP1*, BARD1*, BLM*, BMPR1A*, BRCA1*, BRCA2*, BRIP1*, CASR, CDC73*, CDH1*, CDK4, CDKN1B*, CDKN1C*, CDKN2A, CEBPA, CHEK2*, CTNNA1*, DICER1*, DIS3L2*, EGFR, EPCAM, FH*, FLCN*, GATA2*, GPC3, GREM1, HOXB13, HRAS, KIT, MAX*, MEN1*, MET, MITF, MLH1*, MSH2*, MSH3*, MSH6*, MUTYH*, NBN*, NF1*, NF2*, NTHL1*, PALB2*, PDGFRA, PHOX2B, PMS2*, POLD1*, POLE*, POT1*, PRKAR1A*, PTCH1*, PTEN*, RAD50*, RAD51C*, RAD51D*, RB1*, RECQL4, RET, RUNX1*, SDHA*, SDHAF2*, SDHB*, SDHC*, SDHD*, SMAD4*, SMARCA4*, SMARCB1*, SMARCE1*, STK11*, SUFU*, TERC, TERT, TMEM127*, Tp53*, TSC1*, TSC2*, VHL*, WRN*, and WT1.  RNA analysis is performed for * genes.   Based on Ms. Celaya's personal and family history of cancer, she meets medical criteria for genetic testing. Despite that she meets criteria, she may still have an out of pocket cost. We discussed that if her out of pocket cost for testing is over $100, the laboratory will call and confirm whether she wants to proceed with testing.  If the out of pocket cost of testing is less than $100 she will be billed by the genetic testing laboratory.   PLAN: After considering the risks, benefits, and limitations, Ms. Moradi provided informed consent to pursue genetic testing and the blood sample was sent to [facility] for analysis of the Multi-Cancer Panel+RNA. Results should be available within approximately 2-3 weeks' time, at which point they will be disclosed by telephone to Ms. Hosking, as will any additional recommendations warranted by these results. Ms. Munsch  will receive a summary of her genetic counseling visit and a copy of her results once available. This information will also be available in Epic.   Ms. Ruffino questions were answered to her satisfaction today. Our contact information was provided should additional questions or concerns arise. Thank you for the referral and allowing [place] to share in the care of your patient.   Lucille Passy, MS, [facility] Genetic Counselor [email or web address] (P) [phone]  The patient was seen for a total of 30 minutes in face-to-face genetic counseling.  The patient was seen alone.  Drs. Lindi Adie and/or Burr Medico were available to discuss this case as needed.  _______________________________________________________________________ For Office Staff:  Number of people involved in session: 1 Was an Intern/ student involved with case: no

## 2021-03-07 ENCOUNTER — Telehealth: Payer: Self-pay | Admitting: Genetic Counselor

## 2021-03-07 ENCOUNTER — Encounter: Payer: Self-pay | Admitting: Genetic Counselor

## 2021-03-07 DIAGNOSIS — Z800 Family history of malignant neoplasm of digestive organs: Secondary | ICD-10-CM | POA: Insufficient documentation

## 2021-03-07 DIAGNOSIS — Z1379 Encounter for other screening for genetic and chromosomal anomalies: Secondary | ICD-10-CM | POA: Insufficient documentation

## 2021-03-07 DIAGNOSIS — Z803 Family history of malignant neoplasm of breast: Secondary | ICD-10-CM | POA: Insufficient documentation

## 2021-03-07 NOTE — Telephone Encounter (Signed)
I contacted Ms. Havard to discuss her genetic testing results. No pathogenic variants were identified in the 84 genes analyzed. 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 Detailed clinic note to follow.  The test report has been scanned into EPIC and is located under the Molecular Pathology section of the Results Review tab.  A portion of the result report is included below for reference.   Lucille Passy, MS, [facility] Genetic Counselor [email or web address] (P) [phone]

## 2021-03-09 ENCOUNTER — Telehealth: Payer: Self-pay | Admitting: *Deleted

## 2021-03-09 NOTE — Telephone Encounter (Signed)
Patient called and stated "I had surgery in November with Dr Berline Lopes. I started having vaginal discharge 3 days ago and it is getting worse. Before three days ago I had no issues. I do wear an incontence pad and have to change it three times a day due to the discharge. The discharge is yellowish-green and thick. It has no odor. I have no abdominal pain. I do have pain and stinging near the bartholin gland area. It does hurt to sit. My pain level is at 6. I have been taking tylenol and using heat with no relief. It is not warm to the touch or red. It is a little swollen." Explain to the patient that Dr Berline Lopes is in the OR and the message would be given to her in between cases. Explained that the office will call her back later today. Patient's phone number and pharmacy verified.

## 2021-03-09 NOTE — Telephone Encounter (Signed)
Following up with Christina [name] regarding her COVID test. She reports [facility] [place] and having a test performed there. She does not have those test results back yet. Instructed to call the office when results are in and to call if they haven't resulted before her appointment tomorrow afternoon. Patient verbalized understanding.

## 2021-03-09 NOTE — Telephone Encounter (Signed)
Per Lenna Sciara APP patient scheduled for tomorrow at 2:45 pm

## 2021-03-09 NOTE — Telephone Encounter (Signed)
Following up with Christina [name]. Advised patient that an appointment has been scheduled with Dr. Berline Lopes tomorrow at 2:45 pm. Instructed to take ibuprofen for discomfort and to soak in a warm bath or perform sitz baths.   Patient reports she had a fever of 100.5 three days ago. The past two days her temp has been 97. She reports everyone in her house has been sick with fevers, cough, congestion, runny nose and itchy eyes. She endorses having these symptoms. She has not taken a covid test nor has anyone in her house. Advised patient to take an at home covid test today and to call the clinic back this afternoon with the results. Patient verbalized understanding.

## 2021-03-10 ENCOUNTER — Ambulatory Visit: Payer: Self-pay | Admitting: Gynecologic Oncology

## 2021-03-10 ENCOUNTER — Telehealth: Payer: Self-pay

## 2021-03-10 NOTE — Telephone Encounter (Signed)
Called patient to check on the status of Covid test results. Patient stated that the results are not back yet. Patient stated that she is in more pain than yesterday and rated her pain a 7. "Its in the marsupialization site and it hurts to sit." Informed patient that she will be seen in the clinic whether her results are positive or negative. I told her to call the clinic with results if she gets them before her appointment time and we will proceed with instructions on how to enter the clinic then. Patient verbalized understanding.

## 2021-03-10 NOTE — Telephone Encounter (Signed)
Following up with Christina [name]. Advised patient that Christina [name] agrees she needs to be evaluated. It is not ideal to have her come to the cancer center with immunocompromised patients. Our office is trying to get her an appointment closer to her home, we are trying to get her an appointment at [facility]. Patient verbalized understanding and states she would prefer to see Christina [name] there. Advised patient we will call her with appointment details once we have them. Patient verbalized understanding.

## 2021-03-10 NOTE — Telephone Encounter (Signed)
Attempted to follow up with Christina [name], unable to contact her. Left patient voicemail apologizing for all the back and forth today and advised her that Dr. Berline Lopes is recommending she be evaluated at the [facility] ED. Requested the patient call and follow up with our office.

## 2021-03-14 ENCOUNTER — Ambulatory Visit: Payer: Self-pay | Admitting: Genetic Counselor

## 2021-03-14 ENCOUNTER — Encounter: Payer: Self-pay | Admitting: Gynecologic Oncology

## 2021-03-14 ENCOUNTER — Inpatient Hospital Stay (HOSPITAL_BASED_OUTPATIENT_CLINIC_OR_DEPARTMENT_OTHER): Payer: 59 | Admitting: Gynecologic Oncology

## 2021-03-14 ENCOUNTER — Telehealth: Payer: Self-pay

## 2021-03-14 ENCOUNTER — Inpatient Hospital Stay: Payer: 59

## 2021-03-14 DIAGNOSIS — F17210 Nicotine dependence, cigarettes, uncomplicated: Secondary | ICD-10-CM | POA: Diagnosis not present

## 2021-03-14 DIAGNOSIS — Z800 Family history of malignant neoplasm of digestive organs: Secondary | ICD-10-CM | POA: Diagnosis not present

## 2021-03-14 DIAGNOSIS — U071 COVID-19: Secondary | ICD-10-CM

## 2021-03-14 DIAGNOSIS — Z1379 Encounter for other screening for genetic and chromosomal anomalies: Secondary | ICD-10-CM

## 2021-03-14 DIAGNOSIS — R102 Pelvic and perineal pain: Secondary | ICD-10-CM

## 2021-03-14 DIAGNOSIS — N751 Abscess of Bartholin's gland: Secondary | ICD-10-CM

## 2021-03-14 DIAGNOSIS — Z90710 Acquired absence of both cervix and uterus: Secondary | ICD-10-CM | POA: Diagnosis not present

## 2021-03-14 DIAGNOSIS — Z808 Family history of malignant neoplasm of other organs or systems: Secondary | ICD-10-CM | POA: Diagnosis not present

## 2021-03-14 DIAGNOSIS — C519 Malignant neoplasm of vulva, unspecified: Secondary | ICD-10-CM

## 2021-03-14 DIAGNOSIS — E119 Type 2 diabetes mellitus without complications: Secondary | ICD-10-CM | POA: Diagnosis not present

## 2021-03-14 DIAGNOSIS — Z8041 Family history of malignant neoplasm of ovary: Secondary | ICD-10-CM | POA: Diagnosis not present

## 2021-03-14 DIAGNOSIS — Z803 Family history of malignant neoplasm of breast: Secondary | ICD-10-CM | POA: Diagnosis not present

## 2021-03-14 MED ORDER — CIPROFLOXACIN HCL 500 MG PO TABS: 500.0000 mg | ORAL_TABLET | Freq: Two times a day (BID) | ORAL | 0 refills | Status: AC

## 2021-03-14 NOTE — Progress Notes (Signed)
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  Final pathology shows a 1.8 cm invasive squamous cell carcinoma with associated with high-grade vulvar intraepithelial neoplasia, surgical margins negative for invasive carcinoma.  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  Focal lymphovascular space involvement noted.  Depth of invasion is 5 mm.   12/7: CT scan without definitive evidence of metastatic disease.  Small volume pelvic fluid noted as well as adnexal cyst.  12/29: [facility] appointment to discuss surgery with bilateral inguinal sentinel lymph node biopsy, reexcision of vulvar lesion.  Interval History: Patient initially called last week with symptoms including vaginal discharge, low-grade temperature, and significant vaginal pain.  This was complicated by the fact that she as well as everyone in her house was sick at that time.  The patient had a COVID test that ultimately returned positive.  We attempted to get her scheduled with her OB/GYN, but the patient's preference was to come here.  Today the patient notes some minimal discharge yesterday, denies any significant discharge like she had previously with her Bartholin gland abscess.  The location is somewhat different than where it has been previously, but she has pain with certain movements and sitting.  She reports some pain with bowel movements.  Urinating without issue.  Past  Medical/Surgical History: Past Medical History:  Diagnosis Date   Bartholin's gland abscess    Complication of anesthesia    Difficult to wake up   Diabetes mellitus without complication (HCC)    GERD (gastroesophageal reflux disease)    Hyperlipidemia    SVT (supraventricular tachycardia) ([place])    Vulvar cancer ([place])     Past Surgical History:  Procedure Laterality Date   ABDOMINAL HYSTERECTOMY     ABSCESS DRAINAGE     APPENDECTOMY     BARTHOLIN CYST MARSUPIALIZATION N/A 01/04/2021   Procedure: BARTHOLIN CYST MARSUPIALIZATION;  Surgeon: [name], MD;  Location: [facility];  Service: Gynecology;  Laterality: N/A;   BLADDER SUSPENSION     Removal scar tissue     TONSILLECTOMY     VULVECTOMY N/A 01/04/2021   Procedure: WIDE EXCISION VULVECTOMY WITH REMOVAL OF CLITORIS;  Surgeon: [name], MD;  Location: [facility];  Service: Gynecology;  Laterality: N/A;   WISDOM TOOTH EXTRACTION      Family History  Problem Relation Age of Onset   Cancer Mother    Ovarian cancer Mother        dx. early 26s   Colon cancer Mother 61   Heart disease Mother    Heart disease Father    Heart attack Father    Breast cancer Maternal Aunt 41   Breast cancer Maternal Aunt 40   Liver cancer Maternal Grandfather     Social History   Socioeconomic History   Marital status: Married    Spouse name: Not on file   Number of children: Not on file   Years of education: Not on file   Highest education level: Not on file  Occupational History   Not on file  Tobacco Use  Smoking status: Every Day    Packs/day: 1.00    Years: 15.00    Pack years: 15.00    Types: Cigarettes   Smokeless tobacco: Never  Vaping Use   Vaping Use: Never used  Substance and Sexual Activity   Alcohol use: Yes    Comment: Rare   Drug use: No   Sexual activity: Not Currently    Birth control/protection: None, Surgical  Other Topics Concern   Not on file  Social History Narrative   Not on file    Social Determinants of Health   Financial Resource Strain: Not on file  Food Insecurity: Not on file  Transportation Needs: Not on file  Physical Activity: Not on file  Stress: Not on file  Social Connections: Not on file    Current Medications:  Current Outpatient Medications:    ciprofloxacin (CIPRO) 500 MG tablet, Take 1 tablet (500 mg total) by mouth 2 (two) times daily., Disp: 14 tablet, Rfl: 0   Acetaminophen (TYLENOL PO), Take by mouth as needed., Disp: , Rfl:    fluconazole (DIFLUCAN) 150 MG tablet, Take 1 tablet (150 mg total) by mouth daily., Disp: 2 tablet, Rfl: 0   metFORMIN (GLUCOPHAGE) 500 MG tablet, Take 500 mg by mouth 2 (two) times daily with a meal., Disp: , Rfl:    metoprolol succinate (TOPROL XL) 25 MG 24 hr tablet, Take 1 tablet (25 mg total) by mouth daily., Disp: 90 tablet, Rfl: 2   metroNIDAZOLE (FLAGYL) 500 MG tablet, Take 1 tablet (500 mg total) by mouth 2 (two) times daily. (Patient not taking: Reported on 02/09/2021), Disp: 10 tablet, Rfl: 0   senna-docusate (SENOKOT-S) 8.6-50 MG tablet, Take 2 tablets by mouth at bedtime. Do not take if having diarrhea (Patient not taking: Reported on 01/28/2021), Disp: 30 tablet, Rfl: 0  Review of Systems: Pertinent positives as per HPI, include vaginal discharge and pelvic pain. Denies appetite changes, fevers, chills, fatigue, unexplained weight changes. Denies hearing loss, neck lumps or masses, mouth sores, ringing in ears or voice changes. Denies cough or wheezing.  Denies shortness of breath. Denies chest pain or palpitations. Denies leg swelling. Denies abdominal distention, pain, blood in stools, constipation, diarrhea, nausea, vomiting, or early satiety. Denies pain with intercourse, dysuria, frequency, hematuria or incontinence. Denies hot flashes.   Denies joint pain, back pain or muscle pain/cramps. Denies itching, rash, or wounds. Denies dizziness, headaches, numbness or seizures. Denies swollen lymph  nodes or glands, denies easy bruising or bleeding. Denies anxiety, depression, confusion, or decreased concentration.  Physical Exam: Temperature 98.8 C, blood pressure 146/86, pulse 106, oxygen saturation 99% on room air. General: Alert, oriented, no acute distress. HEENT: Normocephalic, atraumatic, sclera anicteric. Chest: Unlabored breathing on room air  GU: Normal appearing external genitalia without erythema, excoriation, or lesions.  The patient is quite tender on exam.  There is no significant fluctuance or edema noted on the vulva.  With separation of her labia, there is a small area, approximately 2-3 cm, with in the vagina along the left lateral wall, slightly superior to area of previously marsupialized Bartholin gland abscess, that has some fluctuance and seems to be the nidus of her pain.  Minimal discharge noted within the vagina.  Attempt at word catheter placement Preoperative diagnosis: Concern for recurrent Bartholin gland abscess Postoperative diagnosis: Same as above Physician: Berline Lopes MD Estimated blood loss: 25 cc Specimens: Cultures Procedure: After the procedure was discussed with the patient, verbal consent and was placed in dorsolithotomy position.  Topical  anesthesia was first used and then the area to be incised with cleansed with Betadine x3.  2 cc of 2% lidocaine was then injected into the submucosa of the vaginal tissue.  Once anesthesia had taken effect, a scalpel was used to make a small incision of the vaginal epithelium.  This incision was then probed with a Q-tip as well as the culture swabs.  Some minimally purulent bloody discharge was noted.  Given patient's significant pain and intolerance with the exam, an attempt was made at placing the Word catheter, but the procedure was abandoned.  Laboratory & Radiologic Studies: None new  Assessment & Plan: Christina [name] is a 46 y.o. woman with early stage vulvar cancer who has had recurrent Bartholin gland  abscesses, most recently with a marsupialization procedure performed at the time of her initial cancer surgery.  Patient is scheduled to have sentinel lymph node biopsy in February at [facility].  Patient has significant tenderness on palpation.  Her exam is much improved than when I first met her and she had a large Bartholin gland abscess.  There is minimal vaginal discharge, and although there is an area of fluctuance, it is fairly small along the left lateral sidewall.  It is in a slightly different location than her last abscess.  In the setting of recurrent multiple abscesses, I suggested we get some imaging to better characterize this area.  It would be quite unusual for this to be related to a small rectal perforation (given location) but I am concerned with the rapidity with which she redeveloped an abscess after her marsupialization procedure.  In terms of management today, patient was not able to tolerate Word catheter placement.  Given her COVID positive test result recently, I do not think that surgical intervention under anesthesia is advisable as this is nonemergent.  My recommendation was that we proceed with antibiotic therapy.  Cultures were collected today.  We will call her if culture suggests that antibiotics need to be changed.  I will plan to see her back next week.  Depending on her symptoms and the size of this area, we will make a decision about surgical intervention at that time.  32 minutes of total time was spent for this patient encounter, including preparation, face-to-face counseling with the patient and coordination of care, and documentation of the encounter.  Jeral Pinch, MD  Division of Gynecologic Oncology  Department of Obstetrics and Gynecology  [facility]

## 2021-03-14 NOTE — Patient Instructions (Signed)
Please call to let us know who you are doing in the next few days. We will let you know if antibiotics need to be changed.

## 2021-03-14 NOTE — Progress Notes (Signed)
HPI:   [name] was previously seen in the [place] clinic due to a personal and family history of cancer and concerns regarding a hereditary predisposition to cancer. Please refer to our prior cancer genetics clinic note for more information regarding our discussion, assessment and recommendations, at the time. [name] recent genetic test results were disclosed to her, as were recommendations warranted by these results. These results and recommendations are discussed in more detail below.  CANCER HISTORY:  Oncology History   No history exists.    FAMILY HISTORY:  Christina obtained a detailed, 4-generation family history.  Significant diagnoses are listed below:      Family History  Problem Relation Age of Onset   Cancer Mother     Ovarian cancer Mother          dx. early 34s   Colon cancer Mother 38   Heart disease Mother     Heart disease Father     Heart attack Father     Breast cancer Maternal Aunt 65   Breast cancer Maternal Aunt 38   Liver cancer Maternal Grandfather          [name] reports her mother was diagnosed with ovarian cancer in her early 84s and colon cancer in her early 27s. Her mother died due to metastatic colon cancer at age 52. [name] has five maternal aunts. One was diagnosed with breast cancer at age 27 and a second was diagnosed with breast cancer at age 34. Her maternal grandfather died due to liver cancer. Her father died in his late 35s due to a MI. Of note, [name] does not have any information about her paternal family medical history and [name] has limited information about her maternal family medical history.    [name] is unaware of previous family history of genetic testing for hereditary cancer risks. There is  no reported Ashkenazi Jewish ancestry.   GENETIC TEST RESULTS:  The [facility] Multi-Cancer Panel found no pathogenic mutations.   The Multi-Cancer + RNA Panel offered by [facility] includes sequencing and/or  deletion/duplication analysis of the following 84 genes:  AIP*, ALK, APC*, ATM*, AXIN2*, BAP1*, BARD1*, BLM*, BMPR1A*, BRCA1*, BRCA2*, BRIP1*, CASR, CDC73*, CDH1*, CDK4, CDKN1B*, CDKN1C*, CDKN2A, CEBPA, CHEK2*, CTNNA1*, DICER1*, DIS3L2*, EGFR, EPCAM, FH*, FLCN*, GATA2*, GPC3, GREM1, HOXB13, HRAS, KIT, MAX*, MEN1*, MET, MITF, MLH1*, MSH2*, MSH3*, MSH6*, MUTYH*, NBN*, NF1*, NF2*, NTHL1*, PALB2*, PDGFRA, PHOX2B, PMS2*, POLD1*, POLE*, POT1*, PRKAR1A*, PTCH1*, PTEN*, RAD50*, RAD51C*, RAD51D*, RB1*, RECQL4, RET, RUNX1*, SDHA*, SDHAF2*, SDHB*, SDHC*, SDHD*, SMAD4*, SMARCA4*, SMARCB1*, SMARCE1*, STK11*, SUFU*, TERC, TERT, TMEM127*, Tp53*, TSC1*, TSC2*, VHL*, WRN*, and WT1.  RNA analysis is performed for * genes.  The test report has been scanned into EPIC and is located under the Molecular Pathology section of the Results Review tab.  A portion of the result report is included below for reference. Genetic testing reported out on 03/07/2021.       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  At this time, it is unknown if this variant is associated with an increased risk for cancer or if it is benign, but most uncertain variants are reclassified to benign. It should not be used to make medical management decisions. With time, Christina suspect the laboratory will determine the significance of this variant, if any. If the laboratory reclassifies this variant, Christina will attempt to contact [name] to discuss it further.   Even though a pathogenic variant  was not identified, possible explanations for the cancer in the family may include: There may be no hereditary risk for cancer in the family. The cancers in Ms. Wynder and/or her family may be due to other genetic or environmental factors. There may be a gene mutation in one of these genes that current testing methods cannot detect, but that chance is small. There could be another gene that has not yet been  discovered, or that Christina have not yet tested, that is responsible for the cancer diagnoses in the family.  It is also possible there is a hereditary cause for the cancer in the family that [name] did not inherit. The variant of uncertain significance detected in the PALB2 gene may be reclassified as a pathogenic variant in the future. At this time, we do not know if this variant increases the risk for cancer.  Therefore, it is important to remain in touch with cancer genetics in the future so that Christina can continue to offer [name] the most up to date genetic testing.   ADDITIONAL GENETIC TESTING:  Christina discussed with [name] that her genetic testing was fairly extensive.  If there are genes identified to increase cancer risk that can be analyzed in the future, Christina would be happy to discuss and coordinate this testing at that time.    CANCER SCREENING RECOMMENDATIONS:  [name] test result is considered negative (normal).  This means that Christina have not identified a hereditary cause for her personal and family history of cancer at this time.   An individual's cancer risk and medical management are not determined by genetic test results alone. Overall cancer risk assessment incorporates additional factors, including personal medical history, family history, and any available genetic information that may result in a personalized plan for cancer prevention and surveillance. Therefore, it is recommended [name] continue to follow the cancer management and screening guidelines provided by her oncology and primary healthcare provider.  Based on the reported personal and family history, specific cancer screenings for [name] and her family include:  Breast Cancer Screening:  The Tyrer-Cuzick model is one of multiple prediction models developed to estimate an individual's lifetime risk of developing breast cancer. The Tyrer-Cuzick model is endorsed by the [employer]   (NCCN). This model includes many risk factors such as family history, endogenous estrogen exposure, and benign breast disease. The calculation is highly-dependent on the accuracy of clinical data provided by the patient and can change over time. The Tyrer-Cuzick model may be repeated to reflect new information in her personal or family history in the future.   [name] Tyrer-Cuzick risk score was calculated to be 11% on 03/14/2021. Therefore, high risk breast cancer screening is not recommended at this time. [name] is encouraged to contact us regarding any changes to her personal or family history, as her recommendations for screening would be altered significantly if her lifetime risk is determined to be greater than 20% based on updated information. [name] is encouraged to diligently follow standard screening protocols and to maintain regular contact with their local genetics clinic in anticipation of new discoveries regarding hereditary cancer genetic testing.   RECOMMENDATIONS FOR FAMILY MEMBERS:   Since [name] did not inherit a mutation in a cancer predisposition gene included on this panel, her children could not have inherited a mutation from her in one of these genes. Individuals in this family might be at some increased risk of developing cancer, over the general population risk, due to  the family history of cancer. Christina recommend women in this family have a yearly mammogram beginning at age 11, or 68 years younger than the earliest onset of cancer, an annual clinical breast exam, and perform monthly breast self-exams. Other members of the family may still carry a pathogenic variant in one of these genes that [name] did not inherit. Based on the family history, Christina recommend her maternal aunts who have a history of breast cancer pursue genetic counseling and testing.  We do not recommend familial testing for the PALB2 variant of uncertain significance (VUS).  FOLLOW-UP:  Cancer genetics is a rapidly  advancing field and it is possible that new genetic tests will be appropriate for her and/or her family members in the future. Christina encouraged her to remain in contact with cancer genetics on an annual basis so Christina can update her personal and family histories and let her know of advances in cancer genetics that may benefit this family.   Our contact number was provided. [name] questions were answered to her satisfaction, and [name] knows [name] is welcome to call us at anytime with additional questions or concerns.   Lucille Passy, MS, [facility] Genetic Counselor [email or web address] (P) [phone]

## 2021-03-14 NOTE — Telephone Encounter (Signed)
Following up with Christina [name] this morning. Patient states she attempted to go to the ER on 03/10/21 but waited for 6 hours without being seen and left. She reports the pain has improved some. She endorses swelling near her vagina on the left side. Denies redness. Husband assessed the area and thinks it looks ok. She reports a thicker white discharge and is not sure if this is yeast. She rates her pain as 6/10. Her COVID test did come back positive on 03/09/21. Symptoms began over a week ago. "I feel fine with my COVID symptoms, I feel like I have a mild fever but my temperature is 99.5."  Advised patient that a 4:15 pm appointment with Christina [name] has been scheduled for her today. She is in agreement of appointment. She is to arrive 30 minutes prior to her appointment. She is to call the office when she arrives and not to go through the main entrance. A nurse will meet her outside and escort her to an exam room. Patient verbalized understanding.

## 2021-03-15 ENCOUNTER — Telehealth: Payer: Self-pay | Admitting: *Deleted

## 2021-03-15 NOTE — Telephone Encounter (Signed)
Per Dr Berline Lopes scheduled the patient for a MRI on 2/8 at 5 pm. Called and gave the patient the date/time/location. Also rescheduled appt from 2/9 to 2/8

## 2021-03-16 ENCOUNTER — Telehealth: Payer: Self-pay

## 2021-03-16 NOTE — Telephone Encounter (Signed)
Spoke with Ms. Rao this morning to check in since her last visit in our office on 03/14/2021. Patient states she is doing better. She complains of tenderness at the incision site with rating her pain a 2/10. She is managing the pain with tylenol. She states she is still having discomfort when sitting. She is urinating ok as well as having bowel movements. Pt states she is still having discharge. [name], NP notified of pt current status.   Pt aware of next appt on 03/24/2021 at 1:00 pm. Pt instructed to call our office with any concerns.

## 2021-03-18 ENCOUNTER — Telehealth: Payer: Self-pay

## 2021-03-18 NOTE — Telephone Encounter (Signed)
Following up with Ms. Kerrigan this afternoon. Patient states "I'm actually doing pretty good. I don't have any pain, I just have some tenderness. I feel better and I'm able to do some laundry." She denies pain, discharge, irritation, fever or chills. She takes ibuprofen as needed.  Patient reports that her husband is being transferred to [place] on 03/29/21 and they will be moving. She states she is able to keep her appointment for her PET scan on 03/26/21 and will follow up with Dr. Berline Lopes on 03/24/21 but will not be able to have her surgery on 04/08/21. Patient is requesting assistance in establishing care with a GYN Oncologist near [place], [place].  Advised patient that our office will work on this and provide her with the information at her appointment on 03/24/21. Patient verbalized understanding.

## 2021-03-20 LAB — AEROBIC/ANAEROBIC CULTURE W GRAM STAIN (SURGICAL/DEEP WOUND)

## 2021-03-21 ENCOUNTER — Telehealth: Payer: Self-pay

## 2021-03-21 NOTE — Telephone Encounter (Signed)
Following up with Ms. Pistole. Reviewed culture results, per [name], NP sensitivities are looking like switching to penicillin. Patient verbalized understanding. She reports she has been doing pretty good, a little itchy. She denies pain, fever, chills and discharge. She endorses occasional stinging to the incision with urination. Patient states she has completed the course of Cipro. [name], NP notified. Advised patient that Dr. Berline Lopes will be notified and our office will follow up with her in regards to penicillin.  Patient verbalized understanding.

## 2021-03-22 ENCOUNTER — Telehealth: Payer: Self-pay | Admitting: *Deleted

## 2021-03-22 ENCOUNTER — Encounter: Payer: Self-pay | Admitting: Gynecologic Oncology

## 2021-03-22 NOTE — Telephone Encounter (Signed)
Per Dr Berline Lopes and Lenna Sciara APP patient needs to have her PET and MRI scans canceled; along with her surgery and appt with Dr Dionne Milo at [facility] due to moving out of state. Patient will be moving to [place].  Canceled MRI that was scheduled at [facility]. Spoke with Nia at [facility] and canceled the patient's surgery and pre-op/post-op appts.   Spoke with the patient and explained per Dr Berline Lopes "Dr Berline Lopes is canceling your appts and surgery with Dr Dionne Milo at [facility] for you since you are moving. She is also canceling the PET and MRI. The MRI at [facility] is canceled, but our office doesn't know where the PET is scheduled. Also our office is working on a referral to [place]. Dr Berline Lopes will still see you for your follow up appt on Thursday."    Patient stated that "The PET is at [facility] on 2/11 at 9 am; I will call and cancel that appt. Thanks for all the help and see you then."

## 2021-03-23 ENCOUNTER — Telehealth: Payer: Self-pay | Admitting: Oncology

## 2021-03-23 ENCOUNTER — Ambulatory Visit (HOSPITAL_COMMUNITY): Payer: 59

## 2021-03-23 NOTE — Telephone Encounter (Signed)
Called in referral to the [facility].  Appointment scheduled for 04/06/21 at 12:45 with Dr. Kyra Leyland.  Faxed records to [phone].  Called Mckenze and advised her of appointment details and contact information for [facility].  She verbalized understanding and agreement and knows to call with any questions.

## 2021-03-24 ENCOUNTER — Encounter: Payer: Self-pay | Admitting: Gynecologic Oncology

## 2021-03-24 ENCOUNTER — Other Ambulatory Visit: Payer: Self-pay

## 2021-03-24 ENCOUNTER — Inpatient Hospital Stay: Payer: 59 | Attending: Gynecologic Oncology | Admitting: Gynecologic Oncology

## 2021-03-24 VITALS — BP 111/66 | HR 77 | Temp 98.3°F | Resp 16 | Ht 68.5 in | Wt 224.6 lb

## 2021-03-24 DIAGNOSIS — C519 Malignant neoplasm of vulva, unspecified: Secondary | ICD-10-CM | POA: Insufficient documentation

## 2021-03-24 DIAGNOSIS — Z8544 Personal history of malignant neoplasm of other female genital organs: Secondary | ICD-10-CM | POA: Insufficient documentation

## 2021-03-24 DIAGNOSIS — K219 Gastro-esophageal reflux disease without esophagitis: Secondary | ICD-10-CM | POA: Insufficient documentation

## 2021-03-24 DIAGNOSIS — I471 Supraventricular tachycardia: Secondary | ICD-10-CM | POA: Diagnosis not present

## 2021-03-24 DIAGNOSIS — Z7984 Long term (current) use of oral hypoglycemic drugs: Secondary | ICD-10-CM | POA: Diagnosis not present

## 2021-03-24 DIAGNOSIS — Z791 Long term (current) use of non-steroidal anti-inflammatories (NSAID): Secondary | ICD-10-CM | POA: Diagnosis not present

## 2021-03-24 DIAGNOSIS — F17210 Nicotine dependence, cigarettes, uncomplicated: Secondary | ICD-10-CM | POA: Diagnosis not present

## 2021-03-24 DIAGNOSIS — E119 Type 2 diabetes mellitus without complications: Secondary | ICD-10-CM | POA: Diagnosis not present

## 2021-03-24 DIAGNOSIS — Z79899 Other long term (current) drug therapy: Secondary | ICD-10-CM | POA: Insufficient documentation

## 2021-03-24 DIAGNOSIS — N751 Abscess of Bartholin's gland: Secondary | ICD-10-CM | POA: Insufficient documentation

## 2021-03-24 DIAGNOSIS — Z90710 Acquired absence of both cervix and uterus: Secondary | ICD-10-CM | POA: Diagnosis not present

## 2021-03-24 DIAGNOSIS — E785 Hyperlipidemia, unspecified: Secondary | ICD-10-CM | POA: Diagnosis not present

## 2021-03-24 DIAGNOSIS — B3731 Acute candidiasis of vulva and vagina: Secondary | ICD-10-CM | POA: Diagnosis not present

## 2021-03-24 DIAGNOSIS — B379 Candidiasis, unspecified: Secondary | ICD-10-CM

## 2021-03-24 MED ORDER — FLUCONAZOLE 150 MG PO TABS: 150.0000 mg | ORAL_TABLET | Freq: Every day | ORAL | 0 refills | Status: AC

## 2021-03-24 NOTE — Patient Instructions (Signed)
It was good to see you today.  I am glad you are feeling better.  Your exam looks much improved.  I agree that I think you have a yeast infection.  I have sent 2 doses of the medication we have used before for treatment of yeast.  If you do not have significant improvement a couple of days after you take the first dose, please take the second dose.  Please keep [place] posted after you establish care with the GYN oncologist in [place].  Wishing you all the best.

## 2021-03-24 NOTE — Progress Notes (Signed)
Gynecologic Oncology Return Clinic Visit  03/24/2021  Reason for Visit: Follow-up recurrent Bartholin gland abscess  Treatment History: 12/15/2020: Biopsy from periclitoral region revealed at least superficially invasive squamous cell carcinoma, p16 strongly positive. 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  Final pathology shows a 1.8 cm invasive squamous cell carcinoma with associated with high-grade vulvar intraepithelial neoplasia, surgical margins negative for invasive carcinoma.  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  Focal lymphovascular space involvement noted.  Depth of invasion is 5 mm.   12/7: CT scan without definitive evidence of metastatic disease.  Small volume pelvic fluid noted as well as adnexal cyst.  12/29: [facility] appointment to discuss surgery with bilateral inguinal sentinel lymph node biopsy, reexcision of vulvar lesion.  Interval History: Patient presents today for follow-up.  She notes overall doing very well.  She denies any pain.  She finished her antibiotics 2 days ago.  Yesterday, she began having significant vulvar pruritus.  Denies any discharge.  Denies any vaginal bleeding.  Her husband got a promotion at work and they are moving to [place], about an hour outside of [place].  Past Medical/Surgical History: Past Medical History:  Diagnosis Date   Bartholin's gland abscess    Complication of anesthesia    Difficult to wake up   Diabetes mellitus without complication (HCC)    GERD (gastroesophageal reflux disease)    Hyperlipidemia    SVT (supraventricular tachycardia) ([place])    Vulvar cancer ([place])     Past Surgical History:  Procedure Laterality Date   ABDOMINAL HYSTERECTOMY      ABSCESS DRAINAGE     APPENDECTOMY     BARTHOLIN CYST MARSUPIALIZATION N/A 01/04/2021   Procedure: BARTHOLIN CYST MARSUPIALIZATION;  Surgeon: [name], MD;  Location: [facility];  Service: Gynecology;  Laterality: N/A;   BLADDER SUSPENSION     Removal scar tissue     TONSILLECTOMY     VULVECTOMY N/A 01/04/2021   Procedure: WIDE EXCISION VULVECTOMY WITH REMOVAL OF CLITORIS;  Surgeon: [name], MD;  Location: [facility];  Service: Gynecology;  Laterality: N/A;   WISDOM TOOTH EXTRACTION      Family History  Problem Relation Age of Onset   Cancer Mother    Ovarian cancer Mother        dx. early 55s   Colon cancer Mother 109   Heart disease Mother    Heart disease Father    Heart attack Father    Breast cancer Maternal Aunt 41   Breast cancer Maternal Aunt 30   Liver cancer Maternal Grandfather     Social History   Socioeconomic History   Marital status: Married    Spouse name: Not on file   Number of children: Not on file   Years of education: Not on file   Highest education level: Not on file  Occupational History   Not on file  Tobacco Use   Smoking status: Every Day    Packs/day: 1.00    Years: 15.00    Pack years: 15.00    Types: Cigarettes   Smokeless tobacco: Never  Vaping Use   Vaping Use: Never used  Substance and Sexual Activity   Alcohol use: Yes    Comment: Rare   Drug use: No   Sexual  activity: Not Currently    Birth control/protection: None, Surgical  Other Topics Concern   Not on file  Social History Narrative   Not on file   Social Determinants of Health   Financial Resource Strain: Not on file  Food Insecurity: Not on file  Transportation Needs: Not on file  Physical Activity: Not on file  Stress: Not on file  Social Connections: Not on file    Current Medications:  Current Outpatient Medications:    Acetaminophen (TYLENOL PO), Take by mouth as needed., Disp: , Rfl:    fluconazole (DIFLUCAN) 150 MG tablet, Take 1 tablet  (150 mg total) by mouth daily. Take 1 tablet.  If no improvement in your symptoms over 48-72 hours, take the second tablet., Disp: 2 tablet, Rfl: 0   ibuprofen (ADVIL) 200 MG tablet, Take by mouth., Disp: , Rfl:    metFORMIN (GLUCOPHAGE) 500 MG tablet, Take 500 mg by mouth 2 (two) times daily with a meal., Disp: , Rfl:    metoprolol succinate (TOPROL XL) 25 MG 24 hr tablet, Take 1 tablet (25 mg total) by mouth daily., Disp: 90 tablet, Rfl: 2   ciprofloxacin (CIPRO) 500 MG tablet, Take 1 tablet (500 mg total) by mouth 2 (two) times daily. (Patient not taking: Reported on 03/22/2021), Disp: 14 tablet, Rfl: 0   metroNIDAZOLE (FLAGYL) 500 MG tablet, Take 1 tablet (500 mg total) by mouth 2 (two) times daily. (Patient not taking: Reported on 02/09/2021), Disp: 10 tablet, Rfl: 0   senna-docusate (SENOKOT-S) 8.6-50 MG tablet, Take 2 tablets by mouth at bedtime. Do not take if having diarrhea (Patient not taking: Reported on 01/28/2021), Disp: 30 tablet, Rfl: 0  Review of Systems: Pertinent positives include pruritus. Denies appetite changes, fevers, chills, fatigue, unexplained weight changes. Denies hearing loss, neck lumps or masses, mouth sores, ringing in ears or voice changes. Denies cough or wheezing.  Denies shortness of breath. Denies chest pain or palpitations. Denies leg swelling. Denies abdominal distention, pain, blood in stools, constipation, diarrhea, nausea, vomiting, or early satiety. Denies pain with intercourse, dysuria, frequency, hematuria or incontinence. Denies hot flashes, pelvic pain, vaginal bleeding or vaginal discharge.   Denies joint pain, back pain or muscle pain/cramps. Denies rash, or wounds. Denies dizziness, headaches, numbness or seizures. Denies swollen lymph nodes or glands, denies easy bruising or bleeding. Denies anxiety, depression, confusion, or decreased concentration.  Physical Exam: BP 111/66 (BP Location: Left Arm, Patient Position: Sitting)    Pulse 77     Temp 98.3 F (36.8 C) (Oral)    Resp 16    Ht 5' 8.5" (1.74 m)    Wt 224 lb 9.6 oz (101.9 kg)    SpO2 96%    BMI 33.65 kg/m  General: Alert, oriented, no acute distress. HEENT: Normocephalic, atraumatic, sclera anicteric. Chest: Unlabored breathing on room air. GU: External genitalia are notable for well-healed superior incision with some skin bridges noted.  No fluctuance or tenderness with palpation along the left aspect of the vagina, where previously noted Bartholin gland abscess has been.  Some thick white discharge noted as well as erythema of bilateral labia.  Laboratory & Radiologic Studies: None new  Assessment & Plan: Christina [name] is a 46 y.o. 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   Patient  is asymptomatic with regard to her recent Bartholin gland abscess and I do not appreciate any abscess on exam today.  She appears to have Candida infection likely in the setting of antibiotic use.  I sent 2 doses of Diflucan for her to use to treat the symptoms.  With regard to her cancer care, the patient was set up for repeat excision with bilateral sentinel lymph node evaluation in the setting of her early stage vulvar cancer.  This was set up for later this month but given her move this weekend, she will need to reestablish care in [place].  My office called and got her set up to see Dr. Sherren Mocha at the [facility] on 2/22.  Patient is aware of the time and date of this appointment.  Records were faxed.  Stressed the importance of close follow-up given incomplete treatment to date.  The patient is set up for a PET scan on 2/11 at 9 AM.  22 minutes of total time was spent for this patient encounter, including preparation, face-to-face  counseling with the patient and coordination of care, and documentation of the encounter.  Jeral Pinch, MD  Division of Gynecologic Oncology  Department of Obstetrics and Gynecology  [facility]

## 2021-03-31 ENCOUNTER — Telehealth: Payer: Self-pay

## 2021-03-31 NOTE — Telephone Encounter (Signed)
Called Christina [name] and advised her to find a Counselling psychologist near her and to call back with the information.  I will then send the referral.  Halsey also asked if I can call and cancel the appointment in [place].  Advised her that she will need to call.  Gave her my contact information to call back with the facility information in [place].

## 2021-03-31 NOTE — Telephone Encounter (Signed)
Received call from Ms. Scheller this afternoon. Patient has now moved to [place]. She is calling today regarding her upcoming appointment at the [facility] with Dr. Kyra Leyland. Patient reports this is a 3 hour drive from where she is living in [place] but [place], [place] is only 30 miles from her home.  Patient would like referral sent to [facility] instead of [facility]. Advised that our office will look into this and be in touch with her.

## 2021-04-13 ENCOUNTER — Telehealth: Payer: Self-pay | Admitting: Oncology

## 2021-04-13 NOTE — Telephone Encounter (Signed)
Left a message regarding referral to [place].  Requested a return call. ?

## 2021-04-18 NOTE — Telephone Encounter (Signed)
Called [facility] and left a message for Dr. Barnet Pall to see if she would need any records sent before Christina [name]'s appointment. ?

## 2021-04-18 NOTE — Telephone Encounter (Signed)
Called Christina [name] and she has an appointment with Dr. Marshall Cork at [facility] in [place], [place] on 05/05/21.  The office number is [phone].  Advised her that I will call the office to see if they need records faxed to them. ?

## 2021-04-21 ENCOUNTER — Ambulatory Visit: Payer: Self-pay | Admitting: Gynecologic Oncology

## 2021-04-22 ENCOUNTER — Ambulatory Visit: Payer: Self-pay | Admitting: Gynecologic Oncology

## 2021-05-04 ENCOUNTER — Encounter: Payer: Self-pay | Admitting: Oncology

## 2021-05-04 NOTE — Progress Notes (Signed)
Faxed CT results to [facility] for patient's appointment tomorrow. ?

## 2023-02-01 IMAGING — CT CT CHEST W/ CM
2 of 5 series · 12 of 36 positions shown, 15 images · IV contrast (APPLIED)
Comparison: PET-CT, 03/27/2017

CLINICAL DATA: New diagnosis vulvar cancer, status post vulvectomy
2 weeks ago, metastatic disease staging, history of appendectomy

EXAM:
CT CHEST, ABDOMEN, AND PELVIS WITH CONTRAST
TECHNIQUE: Multidetector CT imaging of the chest, abdomen and pelvis was
performed following the standard protocol during bolus
administration of intravenous contrast.
CONTRAST:  80mL OMNIPAQUE IOHEXOL 350 MG/ML SOLN, additional oral
enteric contrast

[Series 2: cap with · axial · 0.98mm/px · z∈[-366,+164]mm · 9 of 134 slices shown, 12 images]
[im 14/134  mediastinal]
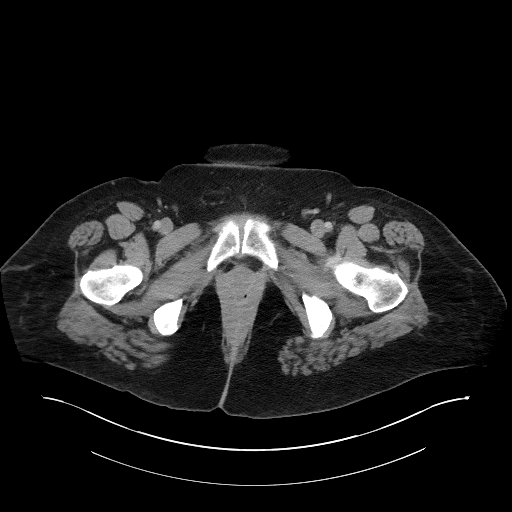
[im 14/134  lung]
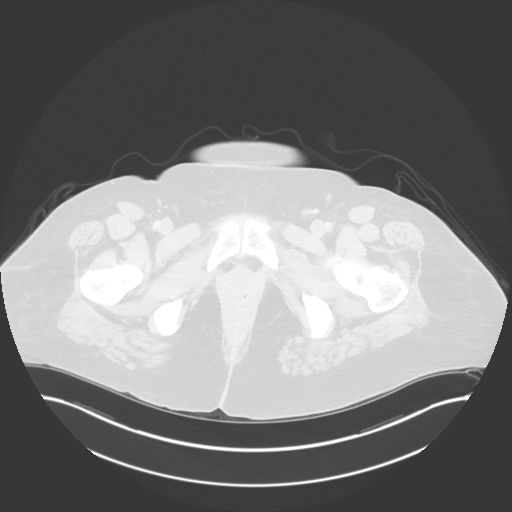
[im 27/134  lung]
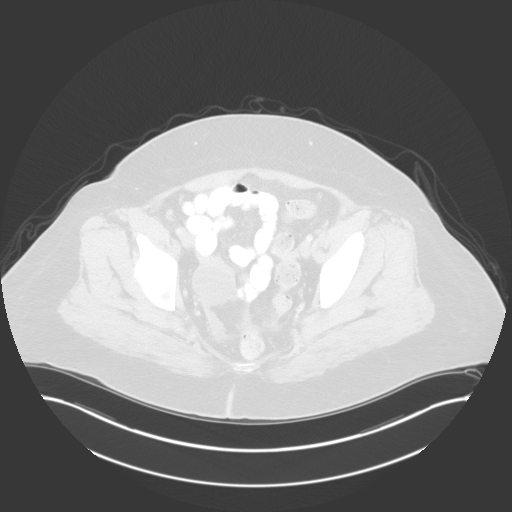
[im 40/134  lung]
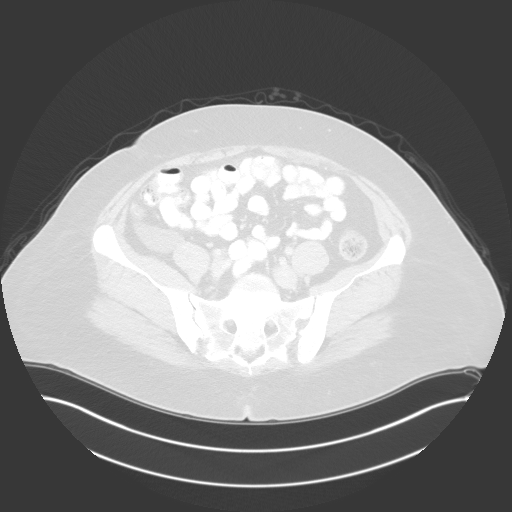
[im 54/134  lung]
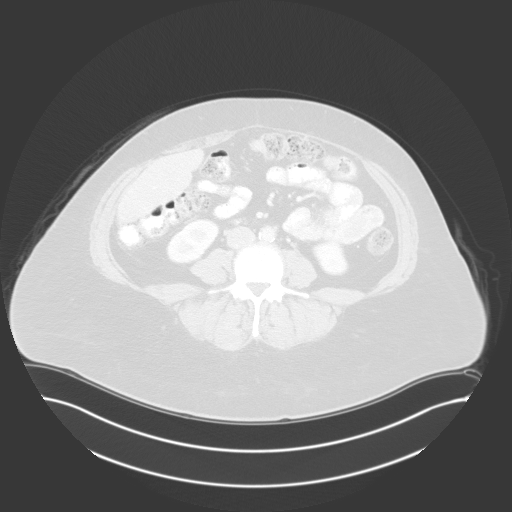
[im 67/134  mediastinal]
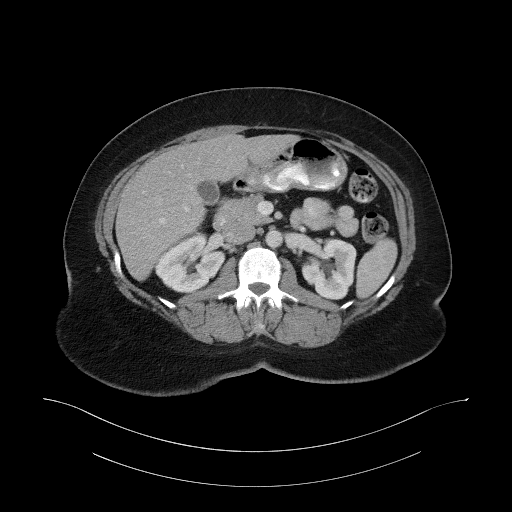
[im 67/134  lung]
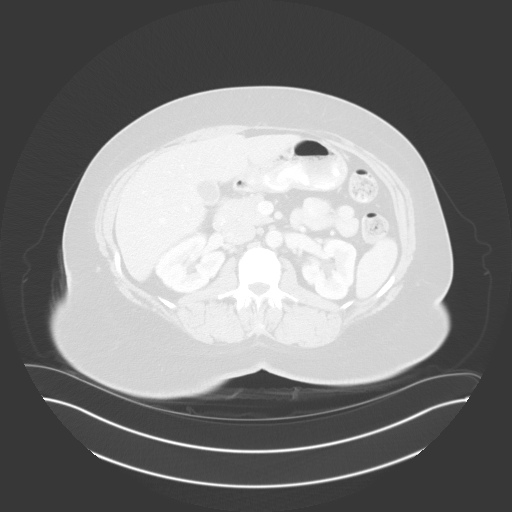
[im 80/134  lung]
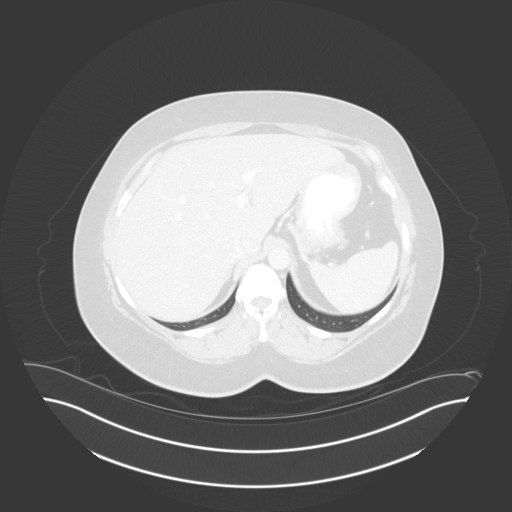
[im 94/134  lung]
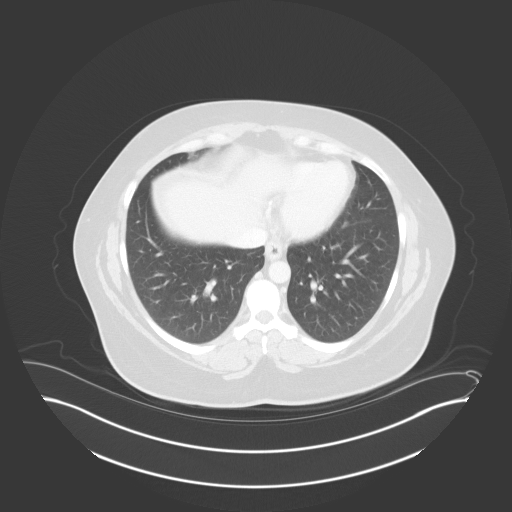
[im 107/134  lung]
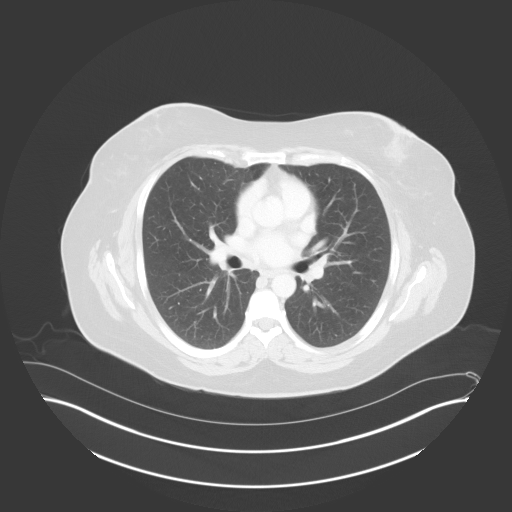
[im 120/134  mediastinal]
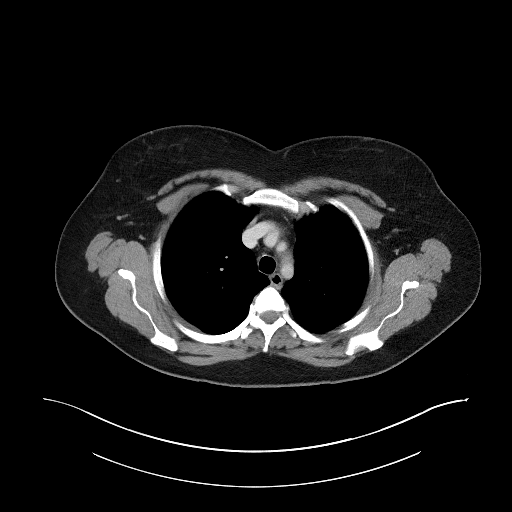
[im 120/134  lung]
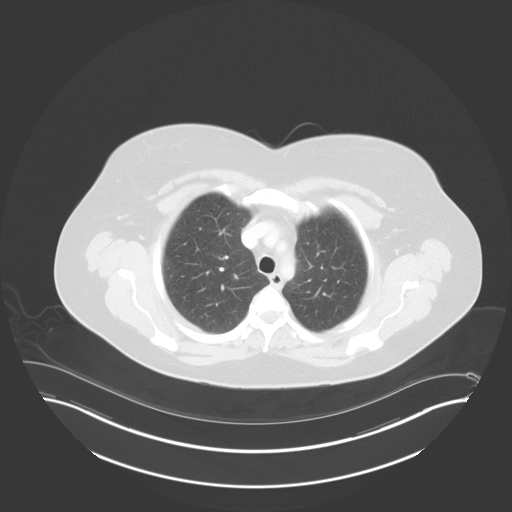

[Series 5: coronals · coronal · 0.82mm/px · 3 of 163 slices shown]
[im 33/163  lung]
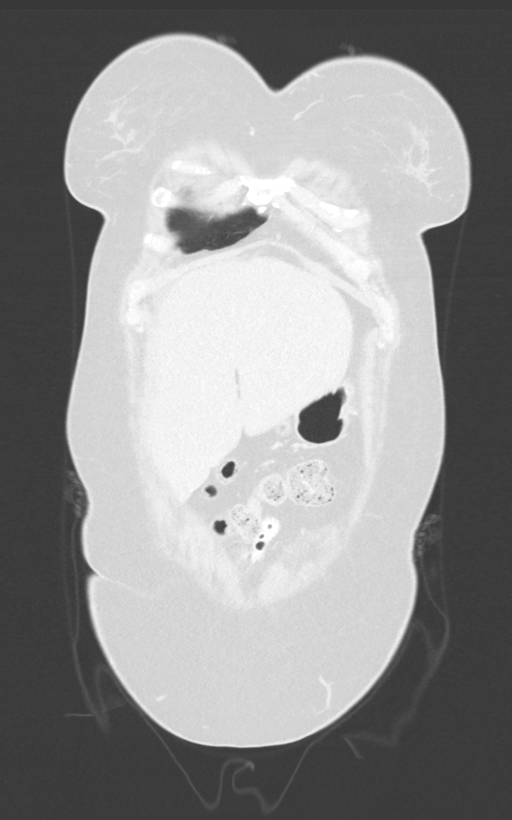
[im 65/163  lung]
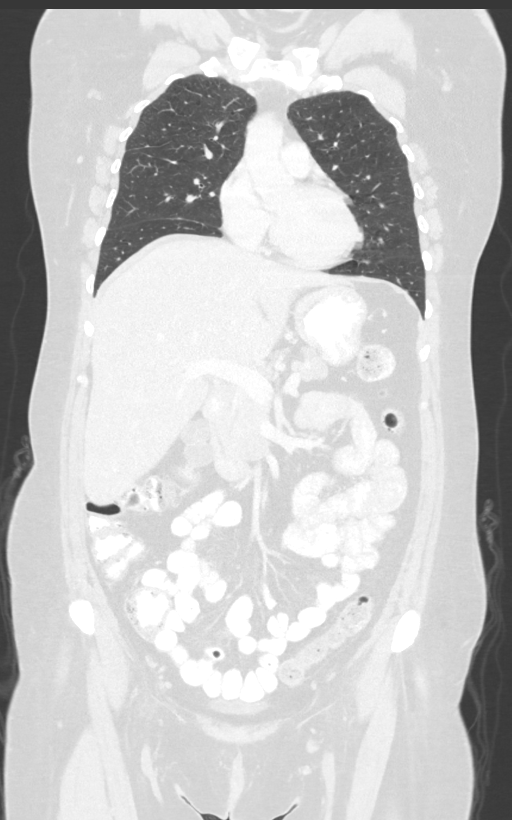
[im 98/163  lung]
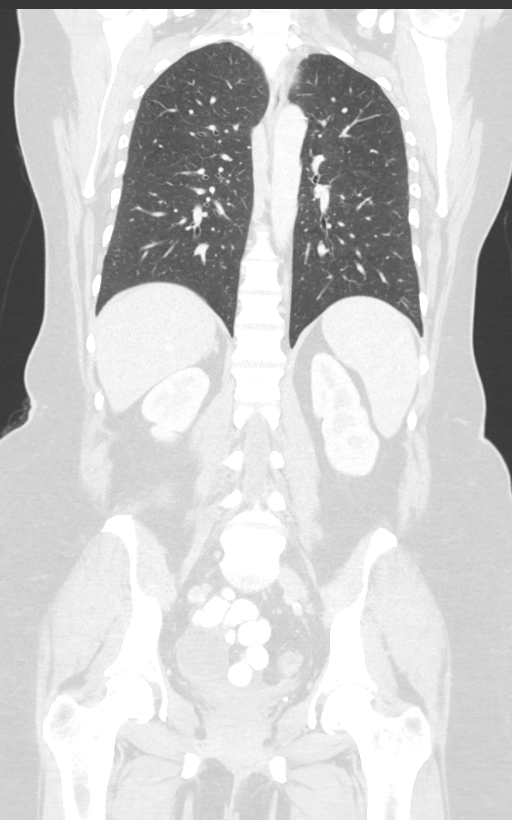

[12 of 36 positions shown; findings below may reference images not displayed]

FINDINGS: CT CHEST FINDINGS

Cardiovascular: Scattered aortic atherosclerosis. Normal heart size.
No pericardial effusion.

Mediastinum/Nodes: No enlarged mediastinal, hilar, or axillary lymph
nodes. Small hiatal hernia. Thyroid gland, trachea, and esophagus
demonstrate no significant findings.

Lungs/Pleura: Background of very fine centrilobular nodularity, most
concentrated in the lung apices. No pleural effusion or
pneumothorax.

Musculoskeletal: No chest wall mass or suspicious bone lesions
identified.

CT ABDOMEN PELVIS FINDINGS

Hepatobiliary: No solid liver abnormality is seen. Hepatomegaly,
maximum coronal span 24.4 cm. No gallstones, gallbladder wall
thickening, or biliary dilatation.

Pancreas: Unremarkable. No pancreatic ductal dilatation or
surrounding inflammatory changes.

Spleen: Normal in size without significant abnormality.

Adrenals/Urinary Tract: Adrenal glands are unremarkable. Kidneys are
normal, without renal calculi, solid lesion, or hydronephrosis.
Bladder is unremarkable.

Stomach/Bowel: Stomach is within normal limits. Status post
appendectomy. Adjacent to the cecal base there is a rim calcified
fluid collection measuring 3.7 x 3.6 cm, unchanged (series 2, image
89). No evidence of bowel wall thickening, distention, or
inflammatory changes.

Vascular/Lymphatic: Aortic atherosclerosis. No enlarged abdominal or
pelvic lymph nodes.

Reproductive: Status post hysterectomy. Right ovarian cyst measuring
4.4 x 3.5 cm (series 2, image 107).

Other: No abdominal wall hernia or abnormality. Small volume
perihepatic and low pelvic ascites.

Musculoskeletal: No acute or significant osseous findings.
IMPRESSION: 1. No definite evidence of lymphadenopathy or metastatic disease in
the chest, abdomen, or pelvis.
2. Small volume nonspecific ascites. There is no appreciable pleural
nodularity or thickening.
3. Status post appendectomy. Adjacent to the cecal base there is a
rim calcified fluid collection measuring 3.7 x 3.6 cm, unchanged.
This finding is benign, previously non FDG avid, and consistent with
postoperative seroma or hematoma.
4. Hepatomegaly.
5. Right ovarian cyst measuring 4.4 x 3.5 cm, likely functional in
the reproductive age setting. Attention on follow-up. No specific
follow-up imaging is recommended given size in the reproductive age
setting. Note: This recommendation does not apply to premenarchal
patients and to those with increased risk (genetic, family history,
elevated tumor markers or other high-risk factors) of ovarian
cancer. Reference: JACR [DATE]):[id number]
6. Aortic atherosclerosis, advanced for patient age.

Aortic Atherosclerosis (58SYR-P4E.E).

## 2023-02-01 IMAGING — CT CT ABD-PELV W/ CM
2 of 5 series · 12 of 36 positions shown, 15 images · IV contrast (APPLIED)
Comparison: PET-CT, 03/27/2017

CLINICAL DATA: New diagnosis vulvar cancer, status post vulvectomy
2 weeks ago, metastatic disease staging, history of appendectomy

EXAM:
CT CHEST, ABDOMEN, AND PELVIS WITH CONTRAST
TECHNIQUE: Multidetector CT imaging of the chest, abdomen and pelvis was
performed following the standard protocol during bolus
administration of intravenous contrast.
CONTRAST:  80mL OMNIPAQUE IOHEXOL 350 MG/ML SOLN, additional oral
enteric contrast

[Series 2: cap with · axial · 0.98mm/px · z∈[-366,+164]mm · 9 of 134 slices shown, 12 images]
[im 14/134  mediastinal]
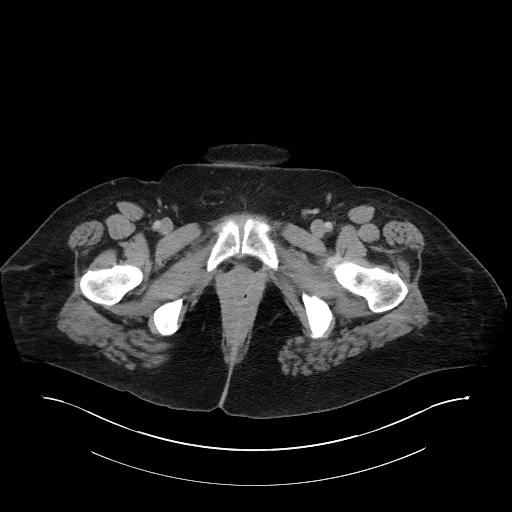
[im 14/134  lung]
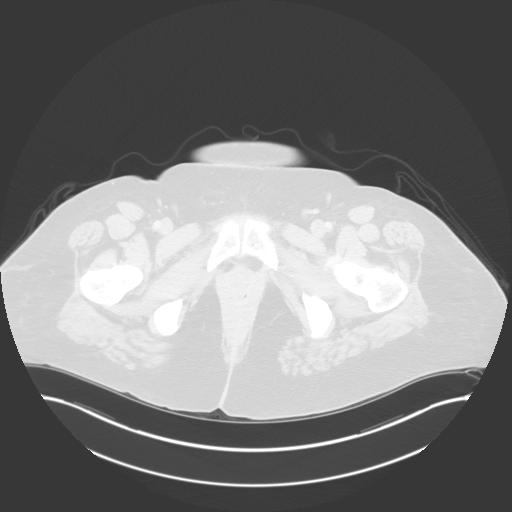
[im 27/134  lung]
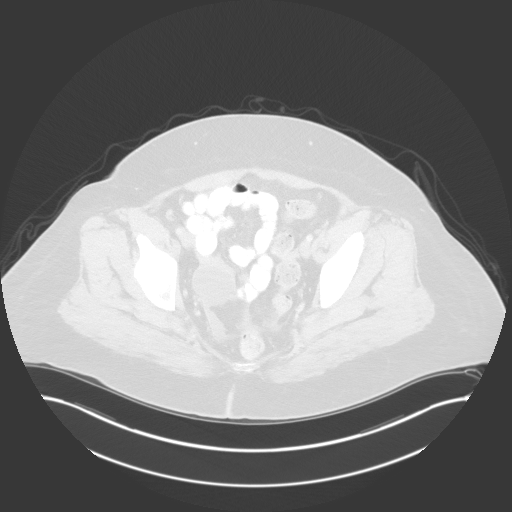
[im 40/134  lung]
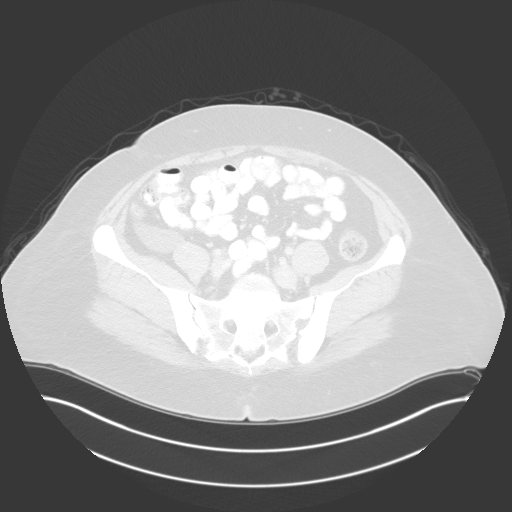
[im 54/134  lung]
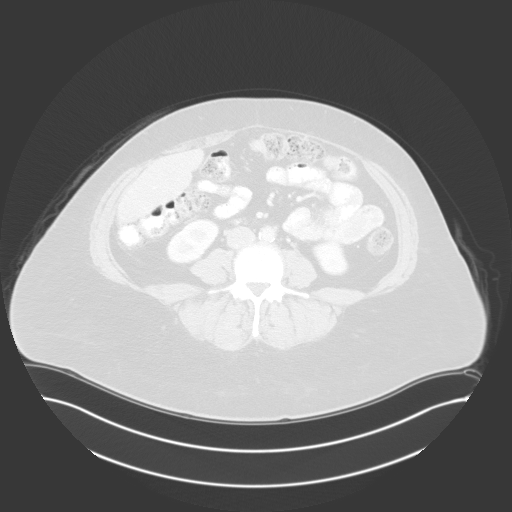
[im 67/134  mediastinal]
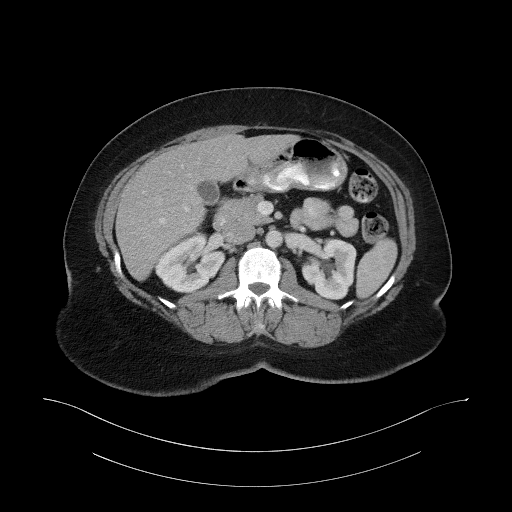
[im 67/134  lung]
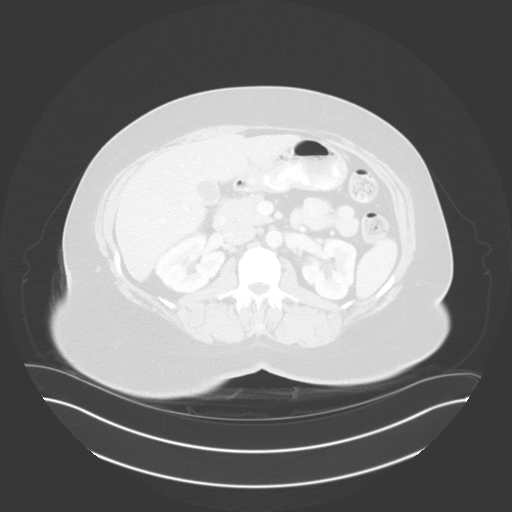
[im 80/134  lung]
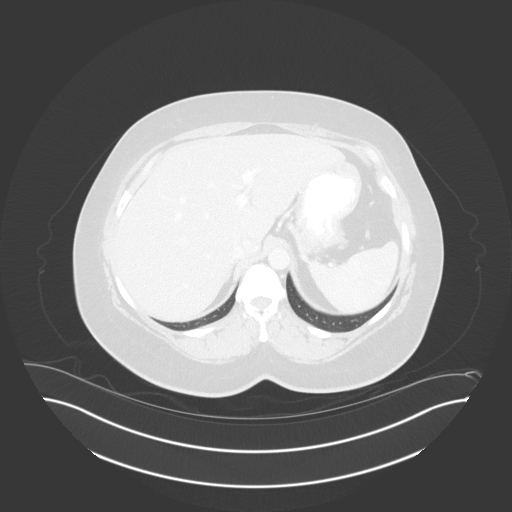
[im 94/134  lung]
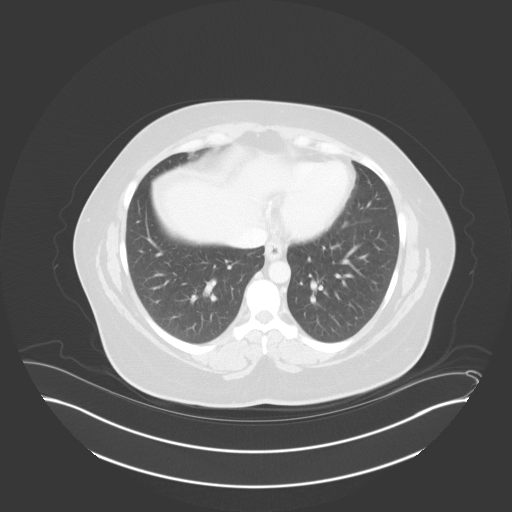
[im 107/134  lung]
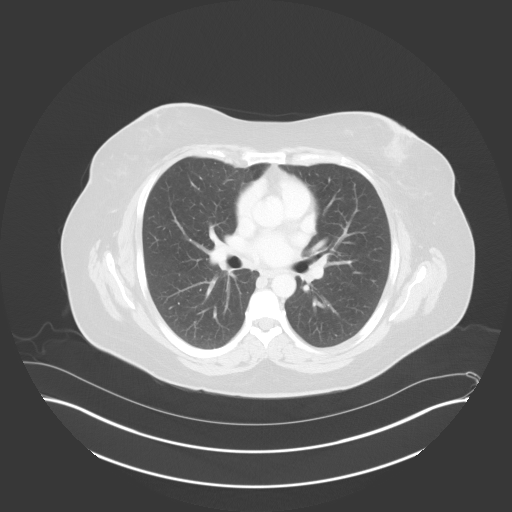
[im 120/134  mediastinal]
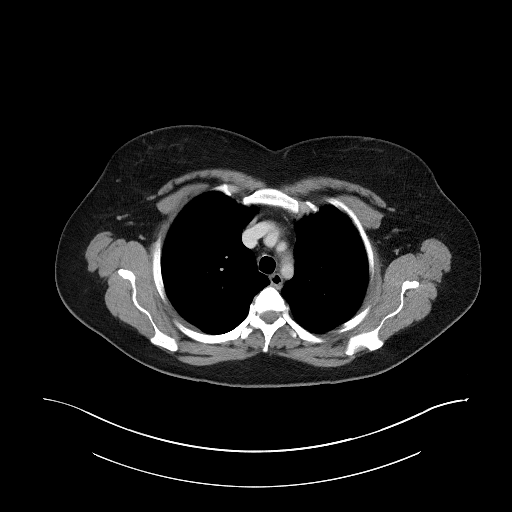
[im 120/134  lung]
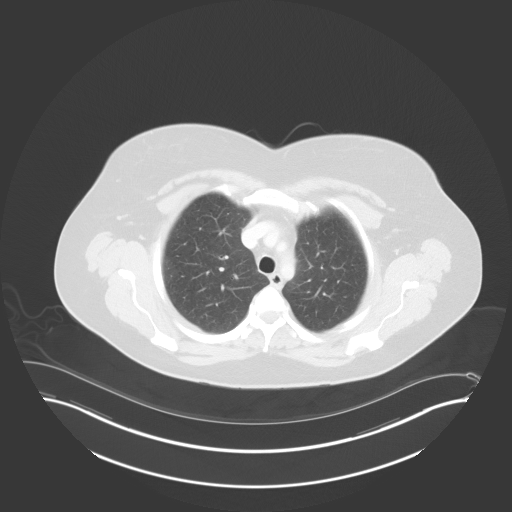

[Series 5: coronals · coronal · 0.82mm/px · 3 of 163 slices shown]
[im 33/163  lung]
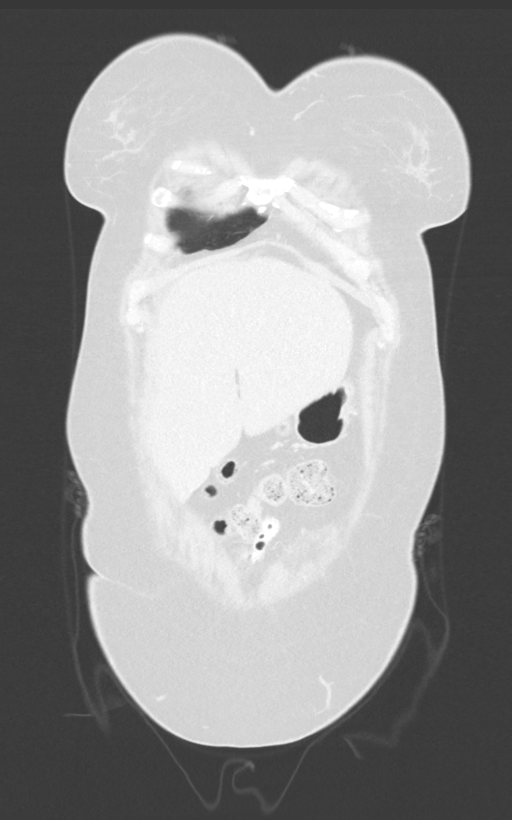
[im 65/163  lung]
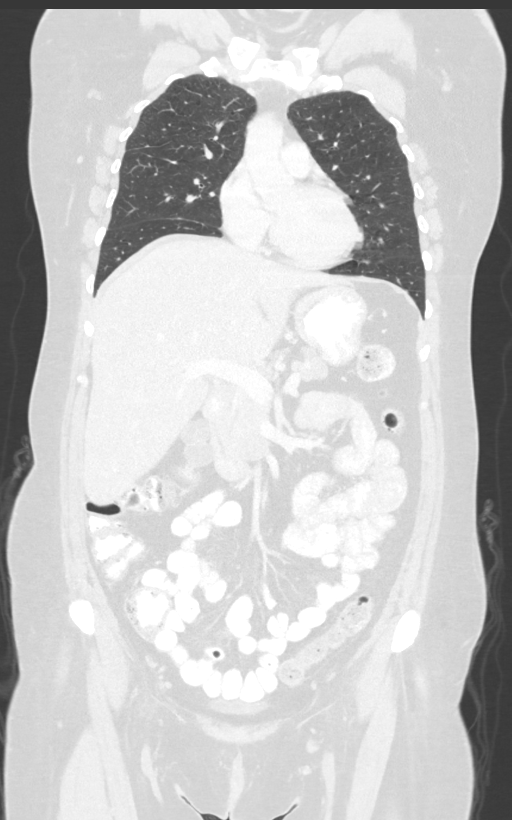
[im 98/163  lung]
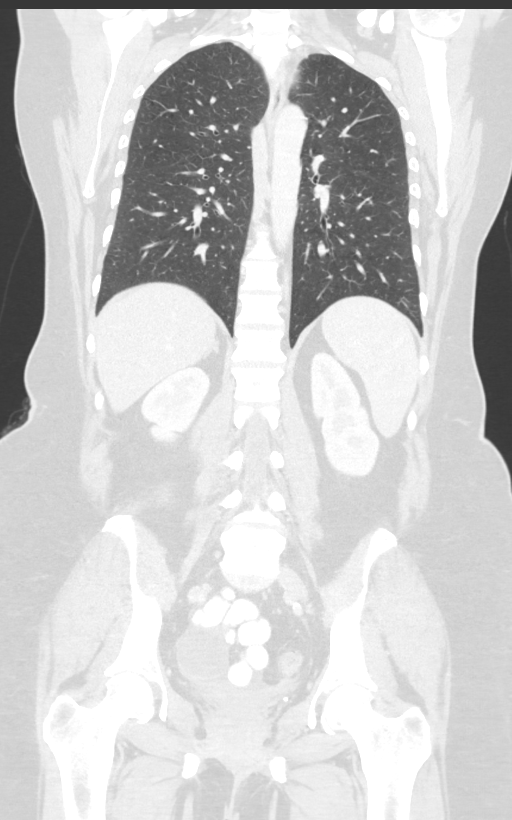

[12 of 36 positions shown; findings below may reference images not displayed]

FINDINGS: CT CHEST FINDINGS

Cardiovascular: Scattered aortic atherosclerosis. Normal heart size.
No pericardial effusion.

Mediastinum/Nodes: No enlarged mediastinal, hilar, or axillary lymph
nodes. Small hiatal hernia. Thyroid gland, trachea, and esophagus
demonstrate no significant findings.

Lungs/Pleura: Background of very fine centrilobular nodularity, most
concentrated in the lung apices. No pleural effusion or
pneumothorax.

Musculoskeletal: No chest wall mass or suspicious bone lesions
identified.

CT ABDOMEN PELVIS FINDINGS

Hepatobiliary: No solid liver abnormality is seen. Hepatomegaly,
maximum coronal span 24.4 cm. No gallstones, gallbladder wall
thickening, or biliary dilatation.

Pancreas: Unremarkable. No pancreatic ductal dilatation or
surrounding inflammatory changes.

Spleen: Normal in size without significant abnormality.

Adrenals/Urinary Tract: Adrenal glands are unremarkable. Kidneys are
normal, without renal calculi, solid lesion, or hydronephrosis.
Bladder is unremarkable.

Stomach/Bowel: Stomach is within normal limits. Status post
appendectomy. Adjacent to the cecal base there is a rim calcified
fluid collection measuring 3.7 x 3.6 cm, unchanged (series 2, image
89). No evidence of bowel wall thickening, distention, or
inflammatory changes.

Vascular/Lymphatic: Aortic atherosclerosis. No enlarged abdominal or
pelvic lymph nodes.

Reproductive: Status post hysterectomy. Right ovarian cyst measuring
4.4 x 3.5 cm (series 2, image 107).

Other: No abdominal wall hernia or abnormality. Small volume
perihepatic and low pelvic ascites.

Musculoskeletal: No acute or significant osseous findings.
IMPRESSION: 1. No definite evidence of lymphadenopathy or metastatic disease in
the chest, abdomen, or pelvis.
2. Small volume nonspecific ascites. There is no appreciable pleural
nodularity or thickening.
3. Status post appendectomy. Adjacent to the cecal base there is a
rim calcified fluid collection measuring 3.7 x 3.6 cm, unchanged.
This finding is benign, previously non FDG avid, and consistent with
postoperative seroma or hematoma.
4. Hepatomegaly.
5. Right ovarian cyst measuring 4.4 x 3.5 cm, likely functional in
the reproductive age setting. Attention on follow-up. No specific
follow-up imaging is recommended given size in the reproductive age
setting. Note: This recommendation does not apply to premenarchal
patients and to those with increased risk (genetic, family history,
elevated tumor markers or other high-risk factors) of ovarian
cancer. Reference: JACR [DATE]):[id number]
6. Aortic atherosclerosis, advanced for patient age.

Aortic Atherosclerosis (58SYR-P4E.E).
